# Patient Record
Sex: Female | Born: 1955 | Race: Black or African American | Hispanic: No | Marital: Single | Smoking: Former smoker
Health system: Southern US, Community
[De-identification: ages and names within clinical notes are randomized; demographics above are authoritative.]

## PROBLEM LIST (undated history)

## (undated) DIAGNOSIS — R112 Nausea with vomiting, unspecified: Secondary | ICD-10-CM

## (undated) DIAGNOSIS — S83207A Unspecified tear of unspecified meniscus, current injury, left knee, initial encounter: Secondary | ICD-10-CM

## (undated) DIAGNOSIS — Z9889 Other specified postprocedural states: Secondary | ICD-10-CM

## (undated) DIAGNOSIS — M1710 Unilateral primary osteoarthritis, unspecified knee: Secondary | ICD-10-CM

## (undated) DIAGNOSIS — M549 Dorsalgia, unspecified: Secondary | ICD-10-CM

## (undated) DIAGNOSIS — Z8719 Personal history of other diseases of the digestive system: Secondary | ICD-10-CM

## (undated) DIAGNOSIS — F419 Anxiety disorder, unspecified: Secondary | ICD-10-CM

## (undated) DIAGNOSIS — M1990 Unspecified osteoarthritis, unspecified site: Secondary | ICD-10-CM

## (undated) DIAGNOSIS — M179 Osteoarthritis of knee, unspecified: Secondary | ICD-10-CM

## (undated) DIAGNOSIS — F329 Major depressive disorder, single episode, unspecified: Secondary | ICD-10-CM

## (undated) DIAGNOSIS — M25561 Pain in right knee: Secondary | ICD-10-CM

## (undated) DIAGNOSIS — G8918 Other acute postprocedural pain: Secondary | ICD-10-CM

## (undated) DIAGNOSIS — I10 Essential (primary) hypertension: Secondary | ICD-10-CM

## (undated) DIAGNOSIS — K219 Gastro-esophageal reflux disease without esophagitis: Secondary | ICD-10-CM

## (undated) DIAGNOSIS — M797 Fibromyalgia: Secondary | ICD-10-CM

## (undated) DIAGNOSIS — Z973 Presence of spectacles and contact lenses: Secondary | ICD-10-CM

## (undated) DIAGNOSIS — F32A Depression, unspecified: Secondary | ICD-10-CM

## (undated) DIAGNOSIS — G8929 Other chronic pain: Secondary | ICD-10-CM

## (undated) HISTORY — PX: DILATION AND CURETTAGE OF UTERUS: SHX78

## (undated) HISTORY — PX: OTHER SURGICAL HISTORY: SHX169

## (undated) HISTORY — PX: KNEE ARTHROSCOPY: SUR90

---

## 1973-07-18 HISTORY — PX: OTHER SURGICAL HISTORY: SHX169

## 1987-07-19 HISTORY — PX: TOTAL ABDOMINAL HYSTERECTOMY W/ BILATERAL SALPINGOOPHORECTOMY: SHX83

## 1994-07-18 HISTORY — PX: TRIGGER FINGER RELEASE: SHX641

## 1994-07-18 HISTORY — PX: CARPAL TUNNEL RELEASE: SHX101

## 2009-01-26 ENCOUNTER — Encounter: Admission: RE | Admit: 2009-01-26 | Discharge: 2009-01-26 | Payer: Self-pay | Admitting: Orthopedic Surgery

## 2009-02-05 ENCOUNTER — Encounter: Admission: RE | Admit: 2009-02-05 | Discharge: 2009-04-10 | Payer: Self-pay | Admitting: Orthopedic Surgery

## 2009-03-09 ENCOUNTER — Encounter: Admission: RE | Admit: 2009-03-09 | Discharge: 2009-03-09 | Payer: Self-pay | Admitting: *Deleted

## 2009-03-10 ENCOUNTER — Encounter: Admission: RE | Admit: 2009-03-10 | Discharge: 2009-03-10 | Payer: Self-pay | Admitting: *Deleted

## 2009-03-14 ENCOUNTER — Encounter: Admission: RE | Admit: 2009-03-14 | Discharge: 2009-03-14 | Payer: Self-pay | Admitting: Orthopedic Surgery

## 2009-03-18 ENCOUNTER — Encounter: Admission: RE | Admit: 2009-03-18 | Discharge: 2009-03-18 | Payer: Self-pay | Admitting: *Deleted

## 2009-05-25 ENCOUNTER — Ambulatory Visit: Payer: Self-pay | Admitting: Occupational Medicine

## 2010-05-19 ENCOUNTER — Ambulatory Visit (HOSPITAL_COMMUNITY): Admission: RE | Admit: 2010-05-19 | Discharge: 2010-05-20 | Payer: Self-pay | Admitting: Orthopedic Surgery

## 2010-05-19 HISTORY — PX: ANTERIOR CERVICAL DECOMP/DISCECTOMY FUSION: SHX1161

## 2010-06-28 ENCOUNTER — Encounter
Admission: RE | Admit: 2010-06-28 | Discharge: 2010-06-28 | Payer: Self-pay | Source: Home / Self Care | Attending: Orthopedic Surgery | Admitting: Orthopedic Surgery

## 2010-06-30 ENCOUNTER — Encounter: Admit: 2010-06-30 | Payer: Self-pay | Admitting: Orthopedic Surgery

## 2010-07-14 ENCOUNTER — Encounter
Admission: RE | Admit: 2010-07-14 | Discharge: 2010-07-15 | Payer: Self-pay | Source: Home / Self Care | Attending: Orthopedic Surgery | Admitting: Orthopedic Surgery

## 2010-08-09 ENCOUNTER — Encounter: Payer: Self-pay | Admitting: *Deleted

## 2010-08-09 ENCOUNTER — Encounter
Admission: RE | Admit: 2010-08-09 | Discharge: 2010-08-09 | Payer: Self-pay | Source: Home / Self Care | Attending: Orthopedic Surgery | Admitting: Orthopedic Surgery

## 2010-08-24 ENCOUNTER — Other Ambulatory Visit (HOSPITAL_COMMUNITY): Payer: Self-pay | Admitting: Orthopedic Surgery

## 2010-08-24 DIAGNOSIS — R52 Pain, unspecified: Secondary | ICD-10-CM

## 2010-08-25 ENCOUNTER — Ambulatory Visit (HOSPITAL_COMMUNITY)
Admission: RE | Admit: 2010-08-25 | Discharge: 2010-08-25 | Disposition: A | Discharge status: Home / Self Care | Payer: BC Managed Care – PPO | Source: Ambulatory Visit | Attending: Orthopedic Surgery | Admitting: Orthopedic Surgery

## 2010-08-25 DIAGNOSIS — I517 Cardiomegaly: Secondary | ICD-10-CM | POA: Insufficient documentation

## 2010-08-25 DIAGNOSIS — R599 Enlarged lymph nodes, unspecified: Secondary | ICD-10-CM | POA: Insufficient documentation

## 2010-08-25 DIAGNOSIS — R52 Pain, unspecified: Secondary | ICD-10-CM

## 2010-08-25 DIAGNOSIS — K7689 Other specified diseases of liver: Secondary | ICD-10-CM | POA: Insufficient documentation

## 2010-08-25 MED ORDER — IOHEXOL 300 MG/ML  SOLN
100.0000 mL | Freq: Once | INTRAMUSCULAR | Status: AC | PRN
  Administered 2010-08-25: 100 mL via INTRAVENOUS

## 2010-09-29 LAB — BASIC METABOLIC PANEL
BUN: 12 mg/dL (ref 6–23)
CO2: 33 mEq/L — ABNORMAL HIGH (ref 19–32)
Calcium: 9.5 mg/dL (ref 8.4–10.5)
Chloride: 101 mEq/L (ref 96–112)
Creatinine, Ser: 1.12 mg/dL (ref 0.4–1.2)
GFR calc Af Amer: >60 mL/min (ref >60)
Glucose, Bld: 99 mg/dL (ref 70–99)

## 2010-09-29 LAB — CBC
MCH: 30.2 pg (ref 26.0–34.0)
MCHC: 33.3 g/dL (ref 30.0–36.0)
MCV: 90.7 fL (ref 78.0–100.0)
Platelets: 328 10*3/uL (ref 150–400)
RBC: 4.53 MIL/uL (ref 3.87–5.11)
RDW: 14.2 % (ref 11.5–15.5)

## 2010-09-29 LAB — SURGICAL PCR SCREEN: Staphylococcus aureus: POSITIVE — AB

## 2010-11-22 ENCOUNTER — Other Ambulatory Visit: Payer: Self-pay | Admitting: Orthopedic Surgery

## 2010-11-22 ENCOUNTER — Ambulatory Visit
Admission: RE | Admit: 2010-11-22 | Discharge: 2010-11-22 | Disposition: A | Discharge status: Home / Self Care | Payer: BC Managed Care – PPO | Source: Ambulatory Visit | Attending: Orthopedic Surgery | Admitting: Orthopedic Surgery

## 2010-11-22 DIAGNOSIS — R52 Pain, unspecified: Secondary | ICD-10-CM

## 2010-11-30 ENCOUNTER — Other Ambulatory Visit: Payer: Self-pay | Admitting: Orthopedic Surgery

## 2010-11-30 DIAGNOSIS — M25561 Pain in right knee: Secondary | ICD-10-CM

## 2010-12-02 ENCOUNTER — Ambulatory Visit
Admission: RE | Admit: 2010-12-02 | Discharge: 2010-12-02 | Disposition: A | Discharge status: Home / Self Care | Payer: BC Managed Care – PPO | Source: Ambulatory Visit | Attending: Orthopedic Surgery | Admitting: Orthopedic Surgery

## 2010-12-02 DIAGNOSIS — M25561 Pain in right knee: Secondary | ICD-10-CM

## 2010-12-09 ENCOUNTER — Other Ambulatory Visit: Payer: Self-pay | Admitting: Physical Medicine and Rehabilitation

## 2010-12-09 DIAGNOSIS — M545 Low back pain, unspecified: Secondary | ICD-10-CM

## 2010-12-24 ENCOUNTER — Other Ambulatory Visit: Payer: BC Managed Care – PPO

## 2010-12-31 ENCOUNTER — Ambulatory Visit
Admission: RE | Admit: 2010-12-31 | Discharge: 2010-12-31 | Disposition: A | Discharge status: Home / Self Care | Payer: BC Managed Care – PPO | Source: Ambulatory Visit | Attending: Physical Medicine and Rehabilitation | Admitting: Physical Medicine and Rehabilitation

## 2010-12-31 DIAGNOSIS — M545 Low back pain, unspecified: Secondary | ICD-10-CM

## 2011-08-20 IMAGING — CT CT CHEST W/ CM
3 of 5 series · 14 of 32 positions shown, 19 images · IV contrast (water/omni  & 100ml omni 300)
Comparison: Plain film of 05/19/2010 of the chest.  Lumbar spine MR
03/14/2009.

CT CHEST

CLINICAL DATA: MRI of the lumbar spine demonstrating
retroperitoneal adenopathy.

CT CHEST, ABDOMEN AND PELVIS WITH CONTRAST
TECHNIQUE: Contiguous axial images of the chest abdomen and pelvis
were obtained after IV contrast administration.
Contrast: 100  ml 3mnipaque-RLL

[Series 2: chest/abd/pelvis · axial · 0.70mm/px · z∈[-516,-446]mm · 2 of 110 slices shown]
[im 14/110  soft-tissue]
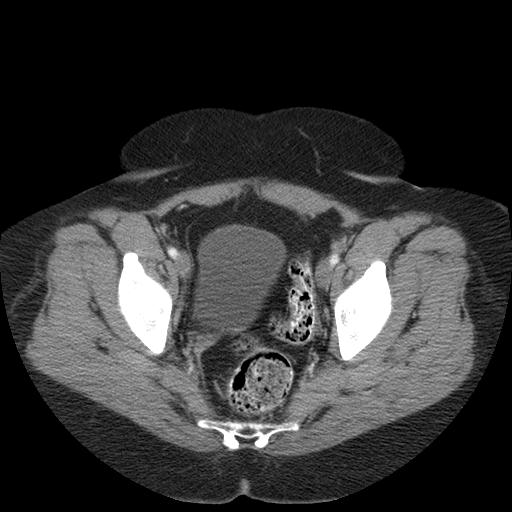
[im 28/110  soft-tissue]
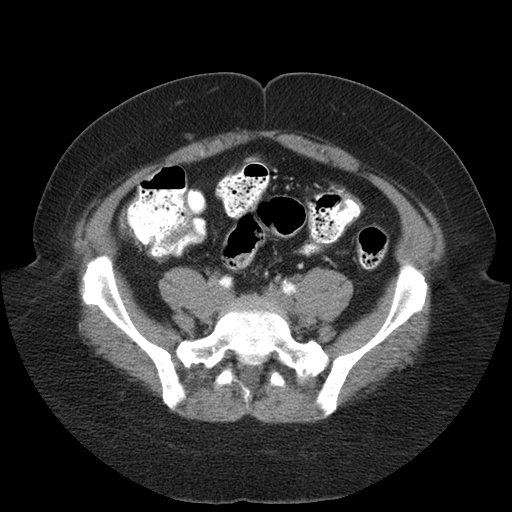

[Series 400: sagittals · sagittal · 1.17mm/px · 7 of 109 slices shown, 12 images]
[im 14/109  soft-tissue]
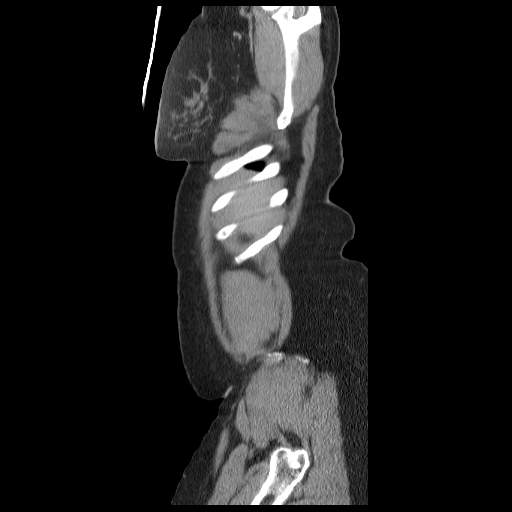
[im 14/109  lung]
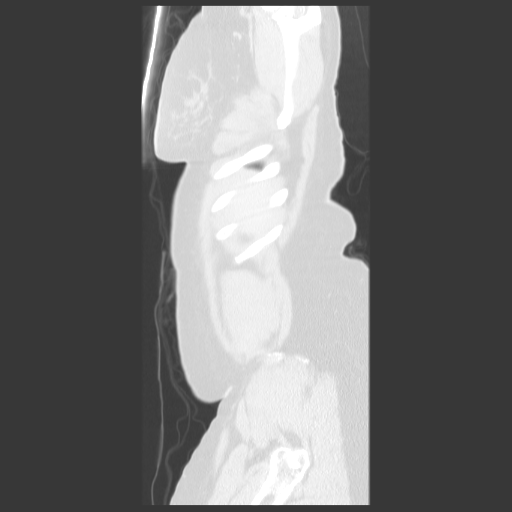
[im 14/109  bone]
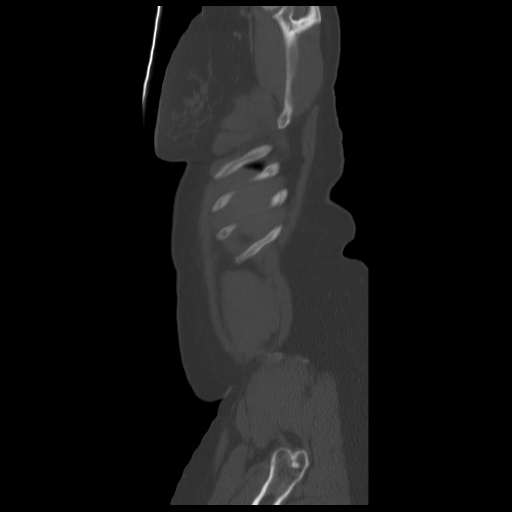
[im 28/109  soft-tissue]
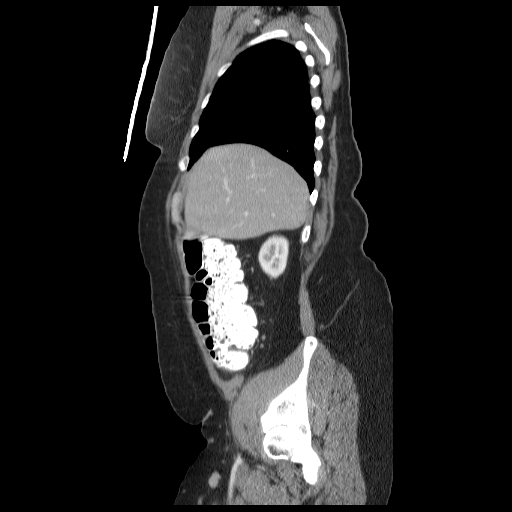
[im 28/109  lung]
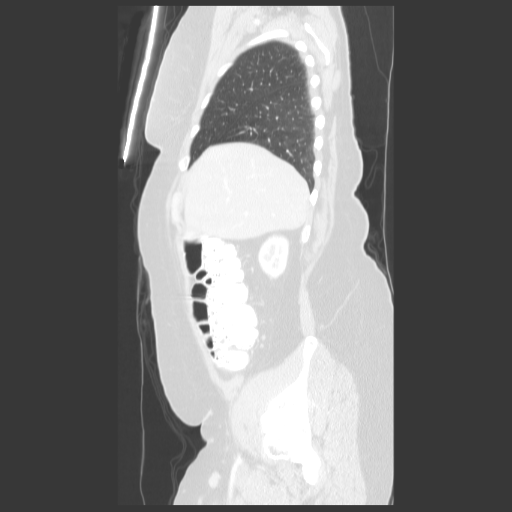
[im 41/109  soft-tissue]
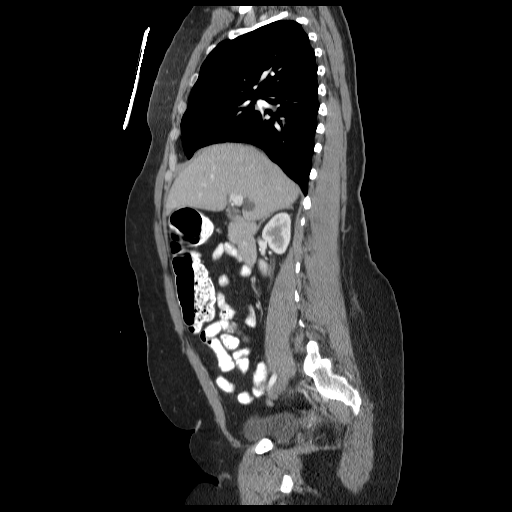
[im 41/109  lung]
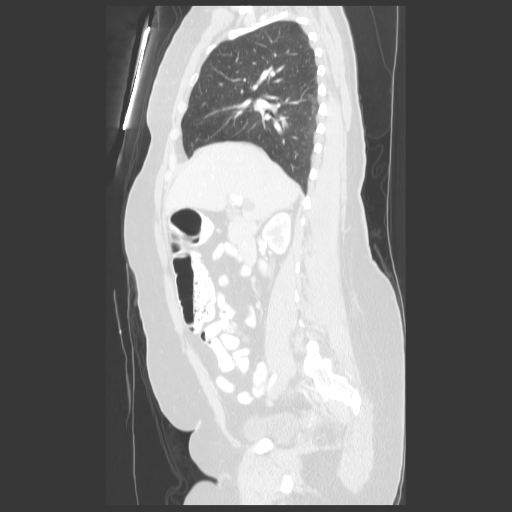
[im 55/109  soft-tissue]
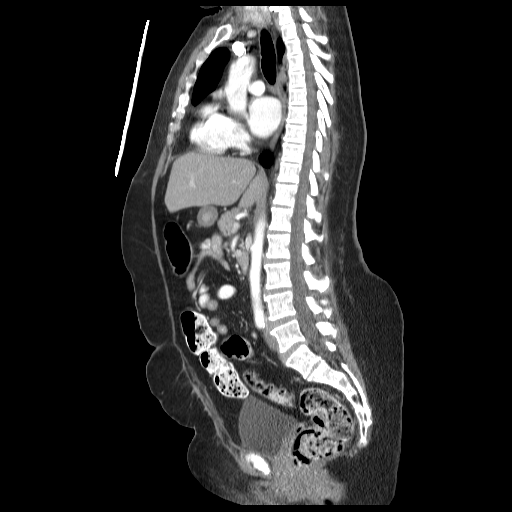
[im 55/109  lung]
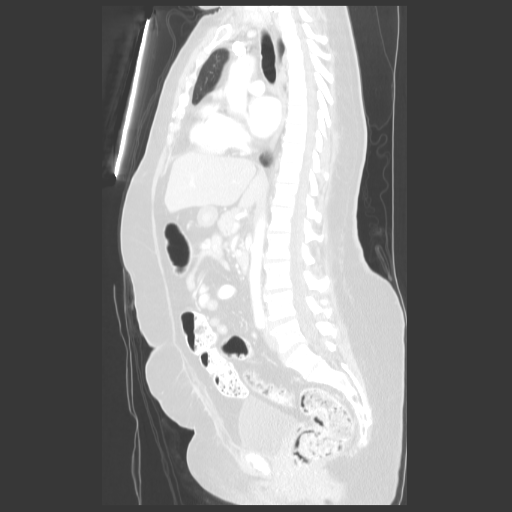
[im 68/109  soft-tissue]
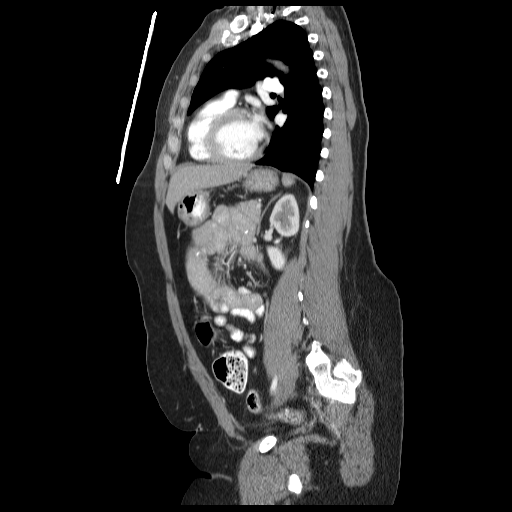
[im 82/109  soft-tissue]
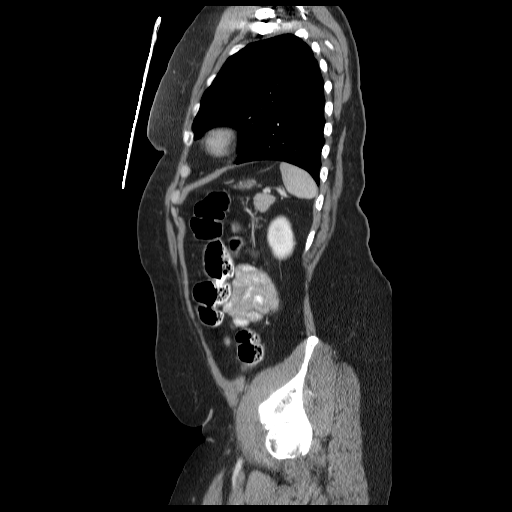
[im 95/109  soft-tissue]
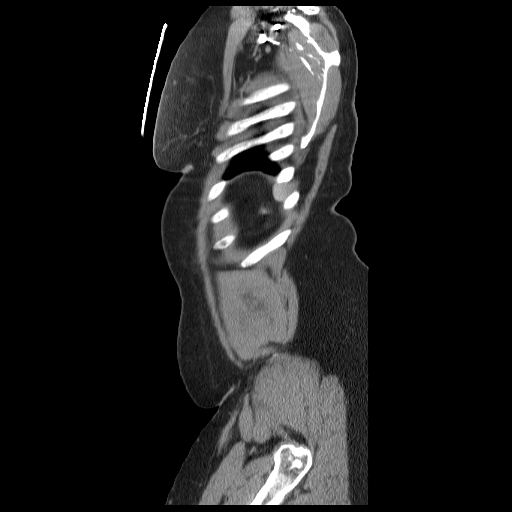

[Series 401: coronals · coronal · 1.17mm/px · 5 of 85 slices shown]
[im 15/85  soft-tissue]
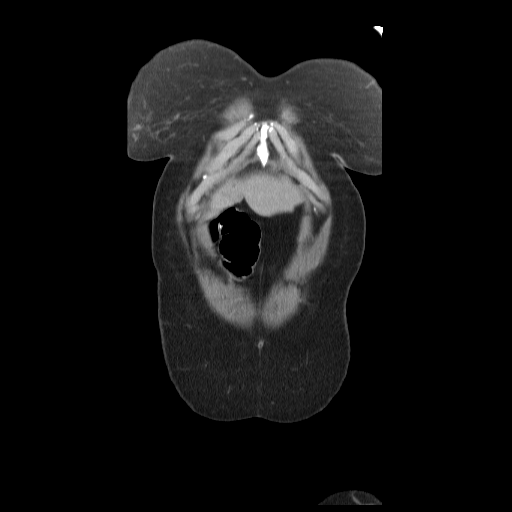
[im 29/85  soft-tissue]
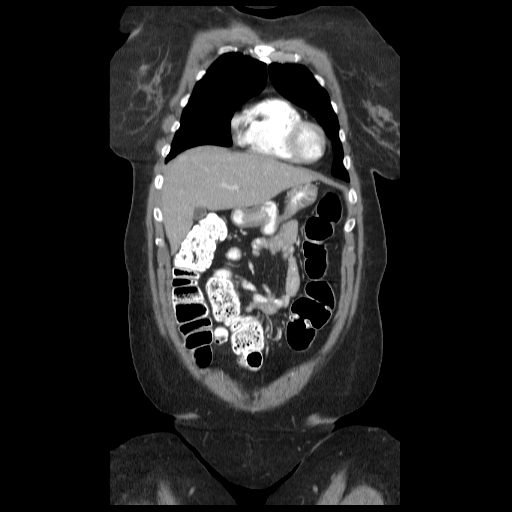
[im 43/85  soft-tissue]
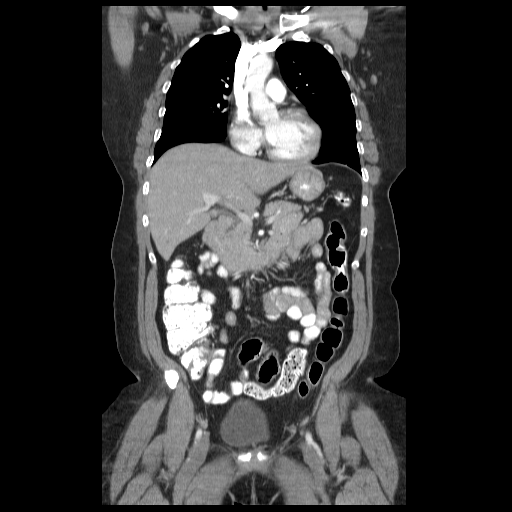
[im 57/85  soft-tissue]
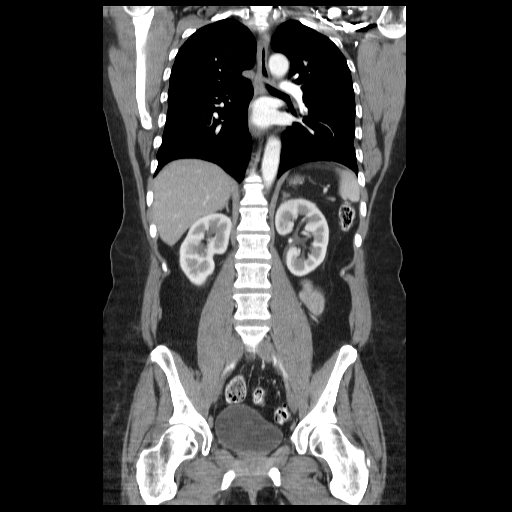
[im 71/85  soft-tissue]
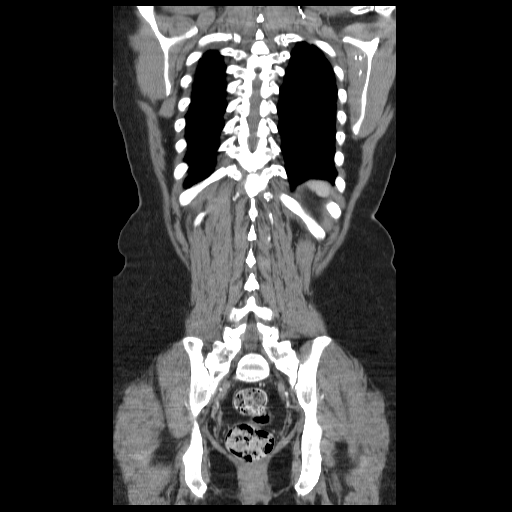

[14 of 32 positions shown; findings below may reference images not displayed]

FINDINGS: Lung windows demonstrate no nodules or airspace
opacities.

Soft tissue windows demonstrate lower cervical spine fixation.  No
axillary adenopathy.  Heart size upper limits of normal without
pericardial or pleural effusion. No mediastinal or hilar
adenopathy.
IMPRESSION: No acute process in the chest.  No adenopathy.

CT ABDOMEN AND PELVIS
FINDINGS: Scattered hepatic cysts and too small to characterize
lesions.  Normal spleen, stomach, pancreas, gallbladder, biliary
tract, adrenal glands, kidneys.

Increased number of retroperitoneal lymph nodes.  Left periaortic
index node measures 1.7 x 0.9 cm on image 68 versus 1.7 x 0.8 cm on
image 7 of series 8 of the prior lumbar spine MR dated 03/14/2009.

Index retrocaval node measures 8 mm on image 67 and is unchanged
from image 7 of series 8 of the 03/14/2009 lumbar spine MR.  No
retrocrural adenopathy.

Moderate stool within the rectum may represent constipation.
Normal terminal ileum.  9 mm left external iliac lymph node is not
pathologic by size criteria. Normal urinary bladder.  Hysterectomy.
No adnexal mass.  No significant free fluid.  No acute osseous
abnormality.
IMPRESSION: 1.  Borderline retroperitoneal adenopathy with increased number and
size of nodes.  These are similar back to 03/14/2009,  most
consistent with a benign/reactive etiology.
2. No acute process in the abdomen or pelvis.
3.  Question constipation.

## 2012-11-28 ENCOUNTER — Encounter (HOSPITAL_COMMUNITY): Payer: Self-pay | Admitting: Pharmacy Technician

## 2012-12-01 NOTE — Pre-Procedure Instructions (Addendum)
Diane Lin  12/01/2012   Your procedure is scheduled on:  Wednesday, May 21st  Report to [facility] at 0630 AM.  Call this number if you have problems the morning of surgery: [phone]   Remember:   Do not eat food or drink liquids after midnight.   Take these medicines the morning of surgery with A SIP OF WATER: xanax, toprol, neurontin, vicodin if needed, estrace, prevacid   Do not wear jewelry, make-up or nail polish.  Do not wear lotions, powders, or perfumes. You may wear deodorant.  Do not shave 48 hours prior to surgery.  Do not bring valuables to the hospital.  Contacts, dentures or bridgework may not be worn into surgery.  Leave suitcase in the car. After surgery it may be brought to your room.  For patients admitted to the hospital, checkout time is 11:00 AM the day of discharge.   Patients discharged the day of surgery will not be allowed to drive home.   Special Instructions: Shower using CHG 2 nights before surgery and the night before surgery.  If you shower the day of surgery use CHG.  Use special wash - you have one bottle of CHG for all showers.  You should use approximately 1/3 of the bottle for each shower.   Please read over the following fact sheets that you were given: Pain Booklet, Coughing and Deep Breathing, MRSA Information and Surgical Site Infection Prevention

## 2012-12-03 ENCOUNTER — Encounter (HOSPITAL_COMMUNITY): Payer: Self-pay

## 2012-12-03 ENCOUNTER — Ambulatory Visit (HOSPITAL_COMMUNITY)
Admission: RE | Admit: 2012-12-03 | Discharge: 2012-12-03 | Disposition: A | Discharge status: Home / Self Care | Payer: BC Managed Care – PPO | Source: Ambulatory Visit | Attending: Orthopedic Surgery | Admitting: Orthopedic Surgery

## 2012-12-03 ENCOUNTER — Encounter (HOSPITAL_COMMUNITY)
Admission: RE | Admit: 2012-12-03 | Discharge: 2012-12-03 | Disposition: A | Discharge status: Home / Self Care | Payer: BC Managed Care – PPO | Source: Ambulatory Visit | Attending: Orthopedic Surgery | Admitting: Orthopedic Surgery

## 2012-12-03 DIAGNOSIS — I10 Essential (primary) hypertension: Secondary | ICD-10-CM | POA: Insufficient documentation

## 2012-12-03 DIAGNOSIS — Z01810 Encounter for preprocedural cardiovascular examination: Secondary | ICD-10-CM | POA: Insufficient documentation

## 2012-12-03 DIAGNOSIS — M5146 Schmorl's nodes, lumbar region: Secondary | ICD-10-CM | POA: Insufficient documentation

## 2012-12-03 DIAGNOSIS — Z01818 Encounter for other preprocedural examination: Secondary | ICD-10-CM | POA: Insufficient documentation

## 2012-12-03 DIAGNOSIS — F17200 Nicotine dependence, unspecified, uncomplicated: Secondary | ICD-10-CM | POA: Insufficient documentation

## 2012-12-03 DIAGNOSIS — M51379 Other intervertebral disc degeneration, lumbosacral region without mention of lumbar back pain or lower extremity pain: Secondary | ICD-10-CM | POA: Insufficient documentation

## 2012-12-03 DIAGNOSIS — M5137 Other intervertebral disc degeneration, lumbosacral region: Secondary | ICD-10-CM | POA: Insufficient documentation

## 2012-12-03 DIAGNOSIS — Z01812 Encounter for preprocedural laboratory examination: Secondary | ICD-10-CM | POA: Insufficient documentation

## 2012-12-03 HISTORY — DX: Fibromyalgia: M79.7

## 2012-12-03 HISTORY — DX: Anxiety disorder, unspecified: F41.9

## 2012-12-03 HISTORY — DX: Nausea with vomiting, unspecified: R11.2

## 2012-12-03 HISTORY — DX: Gastro-esophageal reflux disease without esophagitis: K21.9

## 2012-12-03 HISTORY — DX: Depression, unspecified: F32.A

## 2012-12-03 HISTORY — DX: Personal history of other diseases of the digestive system: Z87.19

## 2012-12-03 HISTORY — DX: Unspecified osteoarthritis, unspecified site: M19.90

## 2012-12-03 HISTORY — DX: Major depressive disorder, single episode, unspecified: F32.9

## 2012-12-03 HISTORY — DX: Essential (primary) hypertension: I10

## 2012-12-03 HISTORY — DX: Other specified postprocedural states: Z98.890

## 2012-12-03 LAB — CBC
HCT: 38.6 % (ref 36.0–46.0)
Hemoglobin: 13 g/dL (ref 12.0–15.0)
MCHC: 33.7 g/dL (ref 30.0–36.0)
RBC: 4.46 MIL/uL (ref 3.87–5.11)

## 2012-12-03 LAB — BASIC METABOLIC PANEL
BUN: 19 mg/dL (ref 6–23)
Chloride: 104 mEq/L (ref 96–112)
GFR calc Af Amer: 66 mL/min — ABNORMAL LOW (ref >90)
GFR calc non Af Amer: 57 mL/min — ABNORMAL LOW (ref >90)
Glucose, Bld: 79 mg/dL (ref 70–99)
Potassium: 3.6 mEq/L (ref 3.5–5.1)
Sodium: 140 mEq/L (ref 135–145)

## 2012-12-03 LAB — SURGICAL PCR SCREEN: MRSA, PCR: NEGATIVE

## 2012-12-03 LAB — ABO/RH: ABO/RH(D): A POS

## 2012-12-03 LAB — TYPE AND SCREEN
ABO/RH(D): A POS
Antibody Screen: NEGATIVE

## 2012-12-03 NOTE — Progress Notes (Signed)
Pt. Reports that Dr. Harlow Mares with [facility] Cares for her med. Needs.  She reports her last ekg would have been when she had surgery here last.- 2011.

## 2012-12-04 MED ORDER — ACETAMINOPHEN 10 MG/ML IV SOLN
1000.0000 mg | Freq: Once | INTRAVENOUS | Status: AC
  Administered 2012-12-05: 1000 mg via INTRAVENOUS
  Filled 2012-12-04: qty 100

## 2012-12-04 MED ORDER — CEFAZOLIN SODIUM-DEXTROSE 2-3 GM-% IV SOLR
2.0000 g | INTRAVENOUS | Status: AC
  Administered 2012-12-05 (×2): 2 g via INTRAVENOUS
  Filled 2012-12-04: qty 50

## 2012-12-04 MED ORDER — DEXAMETHASONE SODIUM PHOSPHATE 4 MG/ML IJ SOLN
4.0000 mg | Freq: Once | INTRAMUSCULAR | Status: AC
  Administered 2012-12-05: 10 mg via INTRAVENOUS
  Filled 2012-12-04 (×2): qty 1

## 2012-12-04 NOTE — Progress Notes (Addendum)
Left message on pt's answering machine informing her of time change for surgery. Stated to arrive at 1030 am and to call back at [phone] for confirmation.  Pt. Returned call, stated she will be here tomorrow at 1030.

## 2012-12-04 NOTE — Progress Notes (Addendum)
Anesthesia Chart Review:  Patient is a 57 year old female scheduled for L5-S1 transforaminal lumbar interbody fusion by Dr. Shon Baton on 12/05/2012. History includes smoking, postoperative nausea/vomiting, hypertension, fibromyalgia, GERD, hiatal hernia, arthritis, depression, anxiety, ACDF, hysterectomy, appendectomy.  PCP is Dr. Harlow Mares with [facility], records requested but pending.  EKG on 12/03/12 showed normal sinus rhythm, possible left atrial enlargement, cannot rule out anterior infarct (age undetermined). Currently, there are no comparison EKGs available.  Chest x-ray on 12/03/2012 showed no acute cardio pulmonary abnormality.  Preoperative labs noted.  I'll review additional PCP records if available, otherwise clinical correlation on the day of surgery.  She has no known history of MI/CHF/DM and no CV symptoms documented at her PAT visit.  If she remains asymptomatic from a CV standpoint then would anticipate she could proceed as planned. (Update: 2012 and 2011 EKGs from PCP office noted.)  Velna Ochs [facility]/Anesthesiology Phone [phone] 12/04/2012 9:57 AM

## 2012-12-05 ENCOUNTER — Inpatient Hospital Stay (HOSPITAL_COMMUNITY)
Admission: RE | Admit: 2012-12-05 | Discharge: 2012-12-07 | DRG: 756 | Disposition: A | Discharge status: Home / Self Care | Payer: BC Managed Care – PPO | Source: Ambulatory Visit | Attending: Orthopedic Surgery | Admitting: Orthopedic Surgery

## 2012-12-05 ENCOUNTER — Ambulatory Visit (HOSPITAL_COMMUNITY): Payer: BC Managed Care – PPO

## 2012-12-05 ENCOUNTER — Encounter (HOSPITAL_COMMUNITY): Payer: Self-pay | Admitting: Vascular Surgery

## 2012-12-05 ENCOUNTER — Encounter (HOSPITAL_COMMUNITY): Payer: Self-pay | Admitting: Anesthesiology

## 2012-12-05 ENCOUNTER — Ambulatory Visit (HOSPITAL_COMMUNITY): Payer: BC Managed Care – PPO | Admitting: Anesthesiology

## 2012-12-05 ENCOUNTER — Encounter (HOSPITAL_COMMUNITY)
Admission: RE | Disposition: A | Discharge status: Home / Self Care | Payer: Self-pay | Source: Ambulatory Visit | Attending: Orthopedic Surgery

## 2012-12-05 DIAGNOSIS — I10 Essential (primary) hypertension: Secondary | ICD-10-CM | POA: Diagnosis present

## 2012-12-05 DIAGNOSIS — F3289 Other specified depressive episodes: Secondary | ICD-10-CM | POA: Diagnosis present

## 2012-12-05 DIAGNOSIS — F329 Major depressive disorder, single episode, unspecified: Secondary | ICD-10-CM | POA: Diagnosis present

## 2012-12-05 DIAGNOSIS — M51379 Other intervertebral disc degeneration, lumbosacral region without mention of lumbar back pain or lower extremity pain: Principal | ICD-10-CM | POA: Diagnosis present

## 2012-12-05 DIAGNOSIS — F411 Generalized anxiety disorder: Secondary | ICD-10-CM | POA: Diagnosis present

## 2012-12-05 DIAGNOSIS — M5137 Other intervertebral disc degeneration, lumbosacral region: Principal | ICD-10-CM | POA: Diagnosis present

## 2012-12-05 DIAGNOSIS — F17200 Nicotine dependence, unspecified, uncomplicated: Secondary | ICD-10-CM | POA: Diagnosis present

## 2012-12-05 DIAGNOSIS — G8929 Other chronic pain: Secondary | ICD-10-CM | POA: Diagnosis present

## 2012-12-05 DIAGNOSIS — IMO0001 Reserved for inherently not codable concepts without codable children: Secondary | ICD-10-CM | POA: Diagnosis present

## 2012-12-05 DIAGNOSIS — K219 Gastro-esophageal reflux disease without esophagitis: Secondary | ICD-10-CM | POA: Diagnosis present

## 2012-12-05 HISTORY — PX: OTHER SURGICAL HISTORY: SHX169

## 2012-12-05 LAB — GLUCOSE, CAPILLARY: Glucose-Capillary: 135 mg/dL — ABNORMAL HIGH (ref 70–99)

## 2012-12-05 SURGERY — POSTERIOR LUMBAR FUSION 1 LEVEL
Anesthesia: General | Site: Spine Lumbar | Wound class: Clean

## 2012-12-05 MED ORDER — ALPRAZOLAM 0.5 MG PO TABS
0.5000 mg | ORAL_TABLET | Freq: Four times a day (QID) | ORAL | Status: DC
Start: 2012-12-05 — End: 2012-12-07
  Administered 2012-12-05 – 2012-12-07 (×6): 0.5 mg via ORAL
  Filled 2012-12-05 (×6): qty 1

## 2012-12-05 MED ORDER — 0.9 % SODIUM CHLORIDE (POUR BTL) OPTIME
TOPICAL | Status: DC | PRN
  Administered 2012-12-05: 1000 mL

## 2012-12-05 MED ORDER — PHENOL 1.4 % MT LIQD: 1.0000 | OROMUCOSAL | Status: DC | PRN

## 2012-12-05 MED ORDER — FENTANYL CITRATE 0.05 MG/ML IJ SOLN
INTRAMUSCULAR | Status: DC | PRN
  Administered 2012-12-05 (×15): 50 ug via INTRAVENOUS

## 2012-12-05 MED ORDER — METHOCARBAMOL 500 MG PO TABS
500.0000 mg | ORAL_TABLET | Freq: Four times a day (QID) | ORAL | Status: DC | PRN
  Administered 2012-12-06 – 2012-12-07 (×2): 500 mg via ORAL
  Filled 2012-12-05 (×2): qty 1

## 2012-12-05 MED ORDER — THROMBIN 20000 UNITS EX SOLR
CUTANEOUS | Status: AC
  Filled 2012-12-05: qty 20000

## 2012-12-05 MED ORDER — SODIUM CHLORIDE 0.9 % IJ SOLN: 9.0000 mL | INTRAMUSCULAR | Status: DC | PRN

## 2012-12-05 MED ORDER — SUCCINYLCHOLINE CHLORIDE 20 MG/ML IJ SOLN
INTRAMUSCULAR | Status: DC | PRN
  Administered 2012-12-05: 120 mg via INTRAVENOUS

## 2012-12-05 MED ORDER — SODIUM CHLORIDE 0.9 % IJ SOLN
3.0000 mL | Freq: Two times a day (BID) | INTRAMUSCULAR | Status: DC
  Administered 2012-12-06 – 2012-12-07 (×3): 3 mL via INTRAVENOUS

## 2012-12-05 MED ORDER — DEXAMETHASONE 4 MG PO TABS
4.0000 mg | ORAL_TABLET | Freq: Four times a day (QID) | ORAL | Status: DC
  Administered 2012-12-05 – 2012-12-07 (×6): 4 mg via ORAL
  Filled 2012-12-05 (×10): qty 1

## 2012-12-05 MED ORDER — DEXAMETHASONE SODIUM PHOSPHATE 4 MG/ML IJ SOLN
4.0000 mg | Freq: Four times a day (QID) | INTRAMUSCULAR | Status: DC
  Filled 2012-12-05 (×8): qty 1

## 2012-12-05 MED ORDER — SODIUM CHLORIDE 0.9 % IJ SOLN: 3.0000 mL | INTRAMUSCULAR | Status: DC | PRN

## 2012-12-05 MED ORDER — METHOCARBAMOL 100 MG/ML IJ SOLN
500.0000 mg | Freq: Four times a day (QID) | INTRAVENOUS | Status: DC | PRN
  Filled 2012-12-05: qty 5

## 2012-12-05 MED ORDER — CEFAZOLIN SODIUM-DEXTROSE 2-3 GM-% IV SOLR
2.0000 g | INTRAVENOUS | Status: DC
  Filled 2012-12-05: qty 50

## 2012-12-05 MED ORDER — ACETAMINOPHEN 10 MG/ML IV SOLN
1000.0000 mg | Freq: Four times a day (QID) | INTRAVENOUS | Status: AC
  Administered 2012-12-05 – 2012-12-06 (×4): 1000 mg via INTRAVENOUS
  Filled 2012-12-05 (×4): qty 100

## 2012-12-05 MED ORDER — OXYCODONE HCL 5 MG PO TABS
10.0000 mg | ORAL_TABLET | ORAL | Status: DC | PRN
  Administered 2012-12-06 – 2012-12-07 (×4): 10 mg via ORAL
  Filled 2012-12-05: qty 2
  Filled 2012-12-05: qty 1
  Filled 2012-12-05: qty 2
  Filled 2012-12-05: qty 1
  Filled 2012-12-05: qty 2

## 2012-12-05 MED ORDER — MENTHOL 3 MG MT LOZG: 1.0000 | LOZENGE | OROMUCOSAL | Status: DC | PRN

## 2012-12-05 MED ORDER — CEFAZOLIN SODIUM-DEXTROSE 2-3 GM-% IV SOLR: 2.0000 g | INTRAVENOUS | Status: DC

## 2012-12-05 MED ORDER — MORPHINE SULFATE (PF) 1 MG/ML IV SOLN
INTRAVENOUS | Status: DC
  Administered 2012-12-05: 19:00:00 via INTRAVENOUS
  Administered 2012-12-05: 10 mg via INTRAVENOUS
  Administered 2012-12-06: 3 mg via INTRAVENOUS

## 2012-12-05 MED ORDER — DIPHENHYDRAMINE HCL 12.5 MG/5ML PO ELIX: 12.5000 mg | ORAL_SOLUTION | Freq: Four times a day (QID) | ORAL | Status: DC | PRN

## 2012-12-05 MED ORDER — NALOXONE HCL 0.4 MG/ML IJ SOLN: 0.4000 mg | INTRAMUSCULAR | Status: DC | PRN

## 2012-12-05 MED ORDER — ACETAMINOPHEN 10 MG/ML IV SOLN
INTRAVENOUS | Status: AC
  Filled 2012-12-05: qty 100

## 2012-12-05 MED ORDER — GABAPENTIN 100 MG PO CAPS
100.0000 mg | ORAL_CAPSULE | Freq: Three times a day (TID) | ORAL | Status: DC
  Administered 2012-12-05 – 2012-12-07 (×5): 100 mg via ORAL
  Filled 2012-12-05 (×8): qty 1

## 2012-12-05 MED ORDER — CEFAZOLIN SODIUM 1-5 GM-% IV SOLN
1.0000 g | Freq: Three times a day (TID) | INTRAVENOUS | Status: AC
  Administered 2012-12-05 – 2012-12-06 (×2): 1 g via INTRAVENOUS
  Filled 2012-12-05 (×2): qty 50

## 2012-12-05 MED ORDER — PROPOFOL 10 MG/ML IV BOLUS
INTRAVENOUS | Status: DC | PRN
  Administered 2012-12-05 (×3): 50 mg via INTRAVENOUS
  Administered 2012-12-05: 200 mg via INTRAVENOUS

## 2012-12-05 MED ORDER — HEMOSTATIC AGENTS (NO CHARGE) OPTIME
TOPICAL | Status: DC | PRN
  Administered 2012-12-05: 1 via TOPICAL

## 2012-12-05 MED ORDER — METOPROLOL SUCCINATE ER 50 MG PO TB24
50.0000 mg | ORAL_TABLET | Freq: Every morning | ORAL | Status: DC
  Administered 2012-12-06 – 2012-12-07 (×2): 50 mg via ORAL
  Filled 2012-12-05 (×2): qty 1

## 2012-12-05 MED ORDER — POLYETHYLENE GLYCOL 3350 17 G PO PACK
17.0000 g | PACK | Freq: Every day | ORAL | Status: DC | PRN
  Filled 2012-12-05: qty 1

## 2012-12-05 MED ORDER — ARTIFICIAL TEARS OP OINT
TOPICAL_OINTMENT | OPHTHALMIC | Status: DC | PRN
  Administered 2012-12-05: 1 via OPHTHALMIC

## 2012-12-05 MED ORDER — MORPHINE SULFATE (PF) 1 MG/ML IV SOLN
INTRAVENOUS | Status: AC
  Filled 2012-12-05: qty 25

## 2012-12-05 MED ORDER — LACTATED RINGERS IV SOLN
INTRAVENOUS | Status: DC | PRN
  Administered 2012-12-05 (×4): via INTRAVENOUS

## 2012-12-05 MED ORDER — HYDROMORPHONE HCL PF 1 MG/ML IJ SOLN: 0.2500 mg | INTRAMUSCULAR | Status: DC | PRN

## 2012-12-05 MED ORDER — OXYCODONE HCL 5 MG/5ML PO SOLN: 5.0000 mg | Freq: Once | ORAL | Status: DC | PRN

## 2012-12-05 MED ORDER — ONDANSETRON HCL 4 MG/2ML IJ SOLN
INTRAMUSCULAR | Status: DC | PRN
  Administered 2012-12-05 (×2): 4 mg via INTRAVENOUS

## 2012-12-05 MED ORDER — BUPIVACAINE-EPINEPHRINE 0.25% -1:200000 IJ SOLN
INTRAMUSCULAR | Status: DC | PRN
  Administered 2012-12-05: 10 mL

## 2012-12-05 MED ORDER — LIDOCAINE HCL (CARDIAC) 20 MG/ML IV SOLN
INTRAVENOUS | Status: DC | PRN
  Administered 2012-12-05: 100 mg via INTRAVENOUS

## 2012-12-05 MED ORDER — PROPOFOL INFUSION 10 MG/ML OPTIME
INTRAVENOUS | Status: DC | PRN
  Administered 2012-12-05: 50 ug/kg/min via INTRAVENOUS

## 2012-12-05 MED ORDER — HYDROCHLOROTHIAZIDE 12.5 MG PO CAPS
12.5000 mg | ORAL_CAPSULE | Freq: Every morning | ORAL | Status: DC
  Administered 2012-12-06 – 2012-12-07 (×2): 12.5 mg via ORAL
  Filled 2012-12-05 (×2): qty 1

## 2012-12-05 MED ORDER — LACTATED RINGERS IV SOLN: INTRAVENOUS | Status: DC

## 2012-12-05 MED ORDER — BUPIVACAINE-EPINEPHRINE PF 0.25-1:200000 % IJ SOLN
INTRAMUSCULAR | Status: AC
  Filled 2012-12-05: qty 30

## 2012-12-05 MED ORDER — MIDAZOLAM HCL 5 MG/5ML IJ SOLN
INTRAMUSCULAR | Status: DC | PRN
  Administered 2012-12-05 (×2): 1 mg via INTRAVENOUS

## 2012-12-05 MED ORDER — ONDANSETRON HCL 4 MG/2ML IJ SOLN: 4.0000 mg | Freq: Four times a day (QID) | INTRAMUSCULAR | Status: DC | PRN

## 2012-12-05 MED ORDER — OXYCODONE HCL 5 MG PO TABS: 5.0000 mg | ORAL_TABLET | Freq: Once | ORAL | Status: DC | PRN

## 2012-12-05 MED ORDER — ONDANSETRON HCL 4 MG/2ML IJ SOLN: 4.0000 mg | INTRAMUSCULAR | Status: DC | PRN

## 2012-12-05 MED ORDER — ZOLPIDEM TARTRATE 5 MG PO TABS
5.0000 mg | ORAL_TABLET | Freq: Every evening | ORAL | Status: DC | PRN
  Administered 2012-12-05: 5 mg via ORAL
  Filled 2012-12-05: qty 1

## 2012-12-05 MED ORDER — SODIUM CHLORIDE 0.9 % IV SOLN: 250.0000 mL | INTRAVENOUS | Status: DC

## 2012-12-05 MED ORDER — THROMBIN 20000 UNITS EX SOLR
CUTANEOUS | Status: DC | PRN
  Administered 2012-12-05: 14:00:00 via TOPICAL

## 2012-12-05 MED ORDER — LACTATED RINGERS IV SOLN
INTRAVENOUS | Status: DC
  Administered 2012-12-05: 11:00:00 via INTRAVENOUS

## 2012-12-05 MED ORDER — DIPHENHYDRAMINE HCL 50 MG/ML IJ SOLN: 12.5000 mg | Freq: Four times a day (QID) | INTRAMUSCULAR | Status: DC | PRN

## 2012-12-05 SURGICAL SUPPLY — 72 items
BLADE SURG ROTATE 9660 (MISCELLANEOUS) IMPLANT
BUR EGG ELITE 4.0 (BURR) ×2 IMPLANT
CAP SPINAL LOCKING TI (Cap) ×8 IMPLANT
CLOTH BEACON ORANGE TIMEOUT ST (SAFETY) ×2 IMPLANT
CLSR STERI-STRIP ANTIMIC 1/2X4 (GAUZE/BANDAGES/DRESSINGS) ×4 IMPLANT
CORDS BIPOLAR (ELECTRODE) ×2 IMPLANT
COVER MAYO STAND STRL (DRAPES) ×4 IMPLANT
COVER SURGICAL LIGHT HANDLE (MISCELLANEOUS) ×2 IMPLANT
DERMABOND ADVANCED (GAUZE/BANDAGES/DRESSINGS) ×1
DERMABOND ADVANCED .7 DNX12 (GAUZE/BANDAGES/DRESSINGS) ×1 IMPLANT
DRAPE C-ARM 42X72 X-RAY (DRAPES) ×4 IMPLANT
DRAPE ORTHO SPLIT 77X108 STRL (DRAPES) ×1
DRAPE POUCH INSTRU U-SHP 10X18 (DRAPES) ×2 IMPLANT
DRAPE SURG 17X23 STRL (DRAPES) ×2 IMPLANT
DRAPE SURG ORHT 6 SPLT 77X108 (DRAPES) ×1 IMPLANT
DRAPE U-SHAPE 47X51 STRL (DRAPES) ×2 IMPLANT
DRSG MEPILEX BORDER 4X8 (GAUZE/BANDAGES/DRESSINGS) ×4 IMPLANT
DURAPREP 26ML APPLICATOR (WOUND CARE) ×2 IMPLANT
ELECT BLADE 4.0 EZ CLEAN MEGAD (MISCELLANEOUS)
ELECT BLADE 6.5 EXT (BLADE) ×2 IMPLANT
ELECT REM PT RETURN 9FT ADLT (ELECTROSURGICAL) ×2
ELECTRODE BLDE 4.0 EZ CLN MEGD (MISCELLANEOUS) IMPLANT
ELECTRODE REM PT RTRN 9FT ADLT (ELECTROSURGICAL) ×1 IMPLANT
GLOVE BIOGEL PI IND STRL 6.5 (GLOVE) IMPLANT
GLOVE BIOGEL PI IND STRL 8.5 (GLOVE) ×2 IMPLANT
GLOVE BIOGEL PI INDICATOR 6.5 (GLOVE)
GLOVE BIOGEL PI INDICATOR 8.5 (GLOVE) ×2
GLOVE ECLIPSE 6.0 STRL STRAW (GLOVE) IMPLANT
GLOVE ECLIPSE 8.5 STRL (GLOVE) ×4 IMPLANT
GOWN PREVENTION PLUS XXLARGE (GOWN DISPOSABLE) ×4 IMPLANT
GOWN STRL NON-REIN LRG LVL3 (GOWN DISPOSABLE) IMPLANT
GUIDEWIRE 1.6X480MM (WIRE) ×8 IMPLANT
IMPLANT TLIF LORDOTIC 12MM LRG (Orthopedic Implant) ×2 IMPLANT
IV CATH 14GX2 1/4 (CATHETERS) IMPLANT
KIT BASIN OR (CUSTOM PROCEDURE TRAY) ×2 IMPLANT
KIT ORACLE NEUROMONITING (KITS) ×2 IMPLANT
KIT POSITION SURG JACKSON T1 (MISCELLANEOUS) IMPLANT
KIT ROOM TURNOVER OR (KITS) ×2 IMPLANT
NEEDLE 22X1 1/2 (OR ONLY) (NEEDLE) ×2 IMPLANT
NEEDLE I-PASS III (NEEDLE) ×2 IMPLANT
NEEDLE SPNL 18GX3.5 QUINCKE PK (NEEDLE) ×2 IMPLANT
NS IRRIG 1000ML POUR BTL (IV SOLUTION) ×2 IMPLANT
PACK LAMINECTOMY ORTHO (CUSTOM PROCEDURE TRAY) ×2 IMPLANT
PACK UNIVERSAL I (CUSTOM PROCEDURE TRAY) ×2 IMPLANT
PAD ARMBOARD 7.5X6 YLW CONV (MISCELLANEOUS) ×4 IMPLANT
PATTIES SURGICAL .5 X.5 (GAUZE/BANDAGES/DRESSINGS) ×2 IMPLANT
PATTIES SURGICAL .5 X1 (DISPOSABLE) ×4 IMPLANT
PUTTY BONE DBX 5CC MIX (Putty) ×2 IMPLANT
ROD 40MM (Rod) ×1 IMPLANT
ROD MATRIX MIS 45MM (Rod) ×2 IMPLANT
ROD SPNL CVD 40X5.5XHRD NS (Rod) ×1 IMPLANT
SCREW MATRIX MIS 7.0X40MM (Screw) ×4 IMPLANT
SCREW MATRIX MIS 7.0X45MM (Screw) ×2 IMPLANT
SCREW MATRIX MIS 8X35MM (Screw) ×2 IMPLANT
SPONGE LAP 4X18 X RAY DECT (DISPOSABLE) ×4 IMPLANT
SPONGE SURGIFOAM ABS GEL 100 (HEMOSTASIS) ×2 IMPLANT
STRIP CLOSURE SKIN 1/2X4 (GAUZE/BANDAGES/DRESSINGS) ×2 IMPLANT
SURGIFLO TRUKIT (HEMOSTASIS) ×2 IMPLANT
SUT MNCRL AB 3-0 PS2 18 (SUTURE) ×4 IMPLANT
SUT VIC AB 1 CT1 27 (SUTURE) ×2
SUT VIC AB 1 CT1 27XBRD ANBCTR (SUTURE) ×2 IMPLANT
SUT VIC AB 2-0 CT1 18 (SUTURE) ×2 IMPLANT
SUT VICRYL 0 UR6 27IN ABS (SUTURE) IMPLANT
SYR BULB IRRIGATION 50ML (SYRINGE) ×2 IMPLANT
SYR CONTROL 10ML LL (SYRINGE) ×4 IMPLANT
TAP CANN 6MM (TAP) ×2 IMPLANT
TAP MATRIX MIS 7.0MM (TAP) ×2 IMPLANT
TOWEL OR 17X24 6PK STRL BLUE (TOWEL DISPOSABLE) ×2 IMPLANT
TOWEL OR 17X26 10 PK STRL BLUE (TOWEL DISPOSABLE) ×2 IMPLANT
TRAY FOLEY CATH 14FR (SET/KITS/TRAYS/PACK) ×2 IMPLANT
WATER STERILE IRR 1000ML POUR (IV SOLUTION) IMPLANT
YANKAUER SUCT BULB TIP NO VENT (SUCTIONS) ×2 IMPLANT

## 2012-12-05 NOTE — Anesthesia Preprocedure Evaluation (Signed)
Anesthesia Evaluation  Patient identified by MRN, date of birth, ID band Patient awake    Reviewed: Allergy & Precautions, H&P , NPO status , Patient's Chart, lab work & pertinent test results  History of Anesthesia Complications (+) PONV  Airway Mallampati: II TM Distance: <3 FB Neck ROM: Full    Dental   Pulmonary COPDCurrent Smoker,  + rhonchi         Cardiovascular hypertension, Rhythm:Regular Rate:Normal     Neuro/Psych Anxiety Depression  Neuromuscular disease    GI/Hepatic hiatal hernia, GERD-  ,  Endo/Other    Renal/GU      Musculoskeletal  (+) Fibromyalgia -  Abdominal (+) + obese,   Peds  Hematology   Anesthesia Other Findings   Reproductive/Obstetrics                           Anesthesia Physical Anesthesia Plan  ASA: III  Anesthesia Plan: General   Post-op Pain Management:    Induction: Intravenous  Airway Management Planned: Oral ETT and Video Laryngoscope Planned  Additional Equipment:   Intra-op Plan:   Post-operative Plan: Extubation in OR  Informed Consent: I have reviewed the patients History and Physical, chart, labs and discussed the procedure including the risks, benefits and alternatives for the proposed anesthesia with the patient or authorized representative who has indicated his/her understanding and acceptance.     Plan Discussed with: CRNA and Surgeon  Anesthesia Plan Comments:         Anesthesia Quick Evaluation

## 2012-12-05 NOTE — H&P (Signed)
History of Present Illness The patient is a 57 year old female who comes in today for a preoperative History and Physical. The patient is scheduled for a TLIF L5-S1 to be performed by Dr. Debria Garret D. Shon Baton, MD at [facility] on [phone] . Please see the hospital record for complete dictated history and physical.    Allergies Cymbalta *ANTIDEPRESSANTS*   Family History Cancer. grandmother mothers side Cerebrovascular Accident. father Congestive Heart Failure. mother and father Depression. mother Heart Disease. mother, father, grandmother fathers side and grandfather fathers side Hypertension. mother, father, sister, brother, grandmother mothers side, grandfather mothers side, grandmother fathers side and grandfather fathers side Rheumatoid Arthritis. mother and father   Social History Alcohol use. never consumed alcohol Children. 0 Current work status. working full time Drug/Alcohol Rehab (Currently). no Illicit drug use. no Living situation. live alone Marital status. single Most recent primary occupation. [place] [employer] Number of flights of stairs before winded. 2-3 Pain Contract. yes Previously in rehab. no Tobacco / smoke exposure. no Tobacco use. current every day smoker; smoke(d) 3 or more pack(s) per day   Medication History ALPRAZolam ER ( Oral) Specific dose unknown - Active. Atorvastatin Calcium ( Oral) Specific dose unknown - Active. ZyrTEC ( Oral) Specific dose unknown - Active. Estrace ( Oral) Specific dose unknown - Active. Prevacid ( Oral) Specific dose unknown - Active. Metoprolol Tartrate ( Oral) Specific dose unknown - Active. MiraLax ( Oral) Specific dose unknown - Active. Ambien ( Oral) Specific dose unknown - Active. Gabapentin ( Oral) Specific dose unknown - Active. Hydrochlorothiazide ( Oral) Specific dose unknown - Active. Norco (5-325MG  Tablet, 1 (one) Oral two times daily, as needed, Taken starting  11/29/2012) Active. ([facility] [phone]/lmw)   Pregnancy / Birth History Pregnant. no   Past Surgical History Appendectomy Arthroscopy of Knee. bilateral Carpal Tunnel Repair. bilateral Neck Disc Surgery Other Surgery. TRIGGER THUMB SURGERY   Other Problems Anxiety Disorder Chronic Pain Depression Fibromyalgia Gastroesophageal Reflux Disease High blood pressure Migraine Headache Osteoarthritis Unspecified Diagnosis   Vitals 11/30/2012 2:36 PM Weight: 213 lb Height: 63 in Body Surface Area: 2.07 m Body Mass Index: 37.73 kg/m Pulse: 67 (Regular) BP: 119/86 (Sitting, Left Arm, Standard)  Objective Transcription  On clinical exam she is alert and oriented times three. No shortness of breath or chest pain. The abdomen is soft and nontender. Compartments in the lower extremity are soft and nontender. Intact peripheral pulses. She has 5/5 strength throughout. She continues to have the right knee problem requiring a brace, but there is no hip or ankle pain on the right side. There is no radicular leg pain. She has horrific back pain with palpation, forward flexion and extension. No obvious skin lesions, abrasions, or contusions. She has no pain with axial pressure applied to the head and the lumbar spine. No SOB/CP Abd soft/NT Significant back pain with ROM    The patient returns today for follow up. I have had an opportunity to review all of her medical records. In 2012, she had a lumbar discogram which demonstrated normal nuclear studies at 3-4 and 4-5. While Dr. Ethelene Hal did indicate she did have pain at the 4-5 injection, he further noted in his conclusion that he felt that this was due to an annular injection and that in fact this was a normal control level. Her pain was reproduced at L5-S1 and there was an annular leak corresponding to disc trauma. Her new MRI still shows the transition vertebra at S1-S2 and there is a castellvi  to be  communication causing bilateral transverse sacral pseudoarthrosis. I do not think, however, this is her major source of pain. The discogram confirms L5-S1, there is a slight anterior listhesis at L5-S1 and this level does correspond to the L5-S1 level seen on her discogram. At this point having tried and failed conservative management and still having predominant discogenic low back pain, we talked about a fusion surgery. She has had previous anterior surgery and has multiple scars and so I do not think an anterior approach is advisable. I think doing a minimally invasive transforaminal lumbar interbody fusion would allow me to address the discogenic back pain and stabilize this level. I told her that the goal of the surgery is reduction in her pain and not elimination of her pain. I do not think extending the fusion into the S1-S2 level is worthwhile and would not really serve to improve her pain. I do think that she has a good chance of a positive result, which would be reduction and pain and improvement in quality of life. At this point I have explained to her the risks which include infection, bleeding, nerve damage, death, stroke, paralysis, failure to heal, nonunion, hardware complications, adjacent segment degeneration, need for further surgery. She has expressed an understanding of this. Once we have clearance from her primary care physician we will move forward with planning a surgical date.

## 2012-12-05 NOTE — Brief Op Note (Signed)
12/05/2012  6:30 PM  PATIENT:  Diane Lin  57 y.o. 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  PROCEDURE:  Procedure(s) with comments: TLIF L5-S1 (N/A) - lumbar five-sacral one tlif with navigation  SURGEON:  Surgeon(s) and Role:    * Venita Lick, MD - Primary  PHYSICIAN ASSISTANT:   ASSISTANTS: none   ANESTHESIA:   general  EBL:  Total I/O In: 2000 [I.V.:2000] Out: 500 [Urine:350; Blood:150]  BLOOD ADMINISTERED:none  DRAINS: none   LOCAL MEDICATIONS USED:  MARCAINE     SPECIMEN:  No Specimen  DISPOSITION OF SPECIMEN:  N/A  COUNTS:  YES  TOURNIQUET:  * No tourniquets in log *  DICTATION: .Other Dictation: Dictation Number [id number]  PLAN OF CARE: Admit to inpatient   PATIENT DISPOSITION:  PACU - hemodynamically stable.

## 2012-12-05 NOTE — Anesthesia Procedure Notes (Signed)
Procedure Name: Intubation Date/Time: 12/05/2012 12:24 PM Performed by: Sherie Don Pre-anesthesia Checklist: Patient identified, Emergency Drugs available, Suction available, Patient being monitored and Timeout performed Patient Re-evaluated:Patient Re-evaluated prior to inductionOxygen Delivery Method: Circle system utilized Preoxygenation: Pre-oxygenation with 100% oxygen Intubation Type: IV induction Ventilation: Mask ventilation with difficulty Laryngoscope Size: Mac and 3 Grade View: Grade I Tube type: Oral Tube size: 7.5 mm Number of attempts: 1 Airway Equipment and Method: Stylet Placement Confirmation: ETT inserted through vocal cords under direct vision,  positive ETCO2 and breath sounds checked- equal and bilateral Secured at: 22 cm Tube secured with: Tape Dental Injury: Teeth and Oropharynx as per pre-operative assessment

## 2012-12-05 NOTE — Transfer of Care (Signed)
Immediate Anesthesia Transfer of Care Note  Patient: Diane Lin  Procedure(s) Performed: Procedure(s) with comments: TLIF L5-S1 (N/A) - lumbar five-sacral one tlif with navigation  Patient Location: PACU  Anesthesia Type:General  Level of Consciousness: awake, alert  and oriented  Airway & Oxygen Therapy: Patient Spontanous Breathing and Patient connected to nasal cannula oxygen  Post-op Assessment: Report given to PACU RN and Patient moving all extremities X 4  Post vital signs: Reviewed and stable  Complications: No apparent anesthesia complications

## 2012-12-05 NOTE — Preoperative (Signed)
Beta Blockers   Reason not to administer Beta Blockers:Toprol XL taken 12/05/12

## 2012-12-05 NOTE — Anesthesia Postprocedure Evaluation (Signed)
Anesthesia Post Note  Patient: Diane Lin  Procedure(s) Performed: Procedure(s) (LRB): TLIF L5-S1 (N/A)  Anesthesia type: general  Patient location: PACU  Post pain: Pain level controlled  Post assessment: Patient's Cardiovascular Status Stable  Last Vitals:  Filed Vitals:   12/05/12 2015  BP: 166/84  Pulse: 72  Temp:   Resp: 14    Post vital signs: Reviewed and stable  Level of consciousness: sedated  Complications: No apparent anesthesia complications

## 2012-12-06 NOTE — Progress Notes (Signed)
I agree with the following treatment note after reviewing documentation.   Johnston, Ein Rijo Brynn   OTR/L Pager: [phone] Office: [phone] .   

## 2012-12-06 NOTE — Evaluation (Signed)
Physical Therapy Evaluation Patient Details Name: Diane Lin MRN: [id number] DOB: April 14, 1956 Today's Date: 12/06/2012 Time: 1478-2956 PT Time Calculation (min): 44 min  PT Assessment / Plan / Recommendation Clinical Impression  57 y/o female s/p TLIF L5-S1, 1 Day Post-Op . Presents to PT with below impairments impacting functional independence. Will benefit physical therapy in the acute setting to maximize mobility and safety for d/c home with assist from family. Education on safe mobility/back precautions provided throughout session due to impulsive behavior immediately upon entering her room.     PT Assessment  Patient needs continued PT services    Follow Up Recommendations  Home health PT (vs none pending progress)    Does the patient have the potential to tolerate intense rehabilitation      Barriers to Discharge Decreased caregiver support 24 hour assist avail for the first week but following that only intermittent    Equipment Recommendations  Rolling walker with 5" wheels    Recommendations for Other Services     Frequency Min 5X/week    Precautions / Restrictions Precautions Precautions: Fall;Back Required Braces or Orthoses: Spinal Brace Spinal Brace: Applied in sitting position;Lumbar corset Restrictions Weight Bearing Restrictions: No   Pertinent Vitals/Pain Reports 9/10 pain (lateral hip/radicular type pain), RN notified      Mobility  Bed Mobility Bed Mobility: Rolling Right;Right Sidelying to Sit Rolling Right: 4: Min guard Right Sidelying to Sit: 4: Min guard;HOB flat Details for Bed Mobility Assistance: impulsive needing cues to slow down and for correct log rolling->sit technique, cues to decrease twisting Transfers Transfers: Sit to Stand;Stand to Sit Sit to Stand: 4: Min assist;With upper extremity assist Stand to Sit: 4: Min assist;With upper extremity assist Details for Transfer Assistance: sit->stand initially patient immediately complaining  of radicular/neuropathic pain in L5-S1 distribution (lateral hip/greater trochanter area) causing great anxiety and impulsive behavior, patient needing max verbal and tactile cues to calm and return to sit to gait composure, then patient stood after calming with min stability assit and RW in front of her, good control and improved pain control using RW to support trunk in standing Ambulation/Gait Ambulation/Gait Assistance: 4: Min guard Ambulation Distance (Feet): 150 Feet Assistive device: Rolling walker Ambulation/Gait Assistance Details: verbal cues for safe positioning and sequencing with RW as well as cues for adherence to precautions especially twisting Gait Pattern: Step-through pattern;Decreased stride length;Decreased trunk rotation Gait velocity: decreased General Gait Details: very cautious pace         PT Diagnosis: Difficulty walking;Generalized weakness;Acute pain  PT Problem List: Decreased strength;Decreased safety awareness;Decreased knowledge of use of DME;Decreased knowledge of precautions;Decreased mobility PT Treatment Interventions: DME instruction;Gait training;Stair training;Functional mobility training;Therapeutic activities;Therapeutic exercise;Balance training;Neuromuscular re-education;Patient/family education   PT Goals Acute Rehab PT Goals PT Goal Formulation: With patient Time For Goal Achievement: 12/13/12 Potential to Achieve Goals: Good Pt will Roll Supine to Right Side: with modified independence PT Goal: Rolling Supine to Right Side - Progress: Goal set today Pt will go Supine/Side to Sit: with modified independence PT Goal: Supine/Side to Sit - Progress: Goal set today Pt will go Sit to Supine/Side: with modified independence PT Goal: Sit to Supine/Side - Progress: Goal set today Pt will go Sit to Stand: with modified independence PT Goal: Sit to Stand - Progress: Goal set today Pt will go Stand to Sit: with modified independence PT Goal: Stand to  Sit - Progress: Goal set today Pt will Ambulate: >150 feet;with modified independence;with least restrictive assistive device PT Goal: Ambulate - Progress: Goal  set today Pt will Go Up / Down Stairs: Flight;with rail(s);with supervision PT Goal: Up/Down Stairs - Progress: Goal set today Additional Goals Additional Goal #1: Pt will verbalize and demonstrate during functional mobility understanding of 3/3 back precautions independently.  PT Goal: Additional Goal #1 - Progress: Goal set today  Visit Information  Last PT Received On: 12/06/12 Assistance Needed: +1    Subjective Data  Subjective: Just tell me "chill Liborio Nixon." Patient Stated Goal: home   Prior Functioning  Home Living Lives With: Alone Available Help at Discharge: Family;Available 24 hours/day (for the first 5 days her niece) Type of Home: Apartment Home Access: Stairs to enter Entrance Stairs-Number of Steps: 15 steps Entrance Stairs-Rails: Left Home Layout: One level Bathroom Shower/Tub: Engineer, manufacturing systems: Standard Home Adaptive Equipment: Straight cane Prior Function Level of Independence: Independent Able to Take Stairs?: Yes Driving: Yes Vocation: Retired Musician: No difficulties    Copywriter, advertising Arousal/Alertness: Awake/alert Behavior During Therapy: Anxious Overall Cognitive Status: Within Functional Limits for tasks assessed    Extremity/Trunk Assessment Right Upper Extremity Assessment RUE ROM/Strength/Tone: Within functional levels Left Upper Extremity Assessment LUE ROM/Strength/Tone: Within functional levels Right Lower Extremity Assessment RLE ROM/Strength/Tone: Within functional levels RLE Sensation: Deficits RLE Sensation Deficits: radiular pain/buring sensation L5-S1 distribution (lateral hip, over the greater tubercle) Left Lower Extremity Assessment LLE ROM/Strength/Tone: Within functional levels Trunk Assessment Trunk Assessment: Normal    Balance    End of Session PT - End of Session Equipment Utilized During Treatment: Gait belt Activity Tolerance: Patient tolerated treatment well Patient left: in chair;with call bell/phone within reach Nurse Communication: Mobility status  GP     [facility] HELEN 12/06/2012, 9:54 AM

## 2012-12-06 NOTE — Progress Notes (Signed)
Orthopedic Tech Progress Note Patient Details:  Diane Lin 05/26/1956 [id number]  Patient ID: Diane Lin, female   DOB: 07/14/1956, 57 y.o.   MRN: [id number]   Diane Lin 12/06/2012, 12:46 PMLumbar corset completed by bio-tech.

## 2012-12-06 NOTE — Progress Notes (Signed)
Occupational Therapy Evaluation Patient Details Name: Diane Lin MRN: [id number] DOB: 06/26/1956 Today's Date: 12/06/2012 Time: 7829-5621 OT Time Calculation (min): 43 min  OT Assessment / Plan / Recommendation Clinical Impression  The patient is a 57 year old female who had a TLIF L5-S1. Pt is very friendly and receptive to education. Pt needs mod cueing to keep precautions (Pt given BAT handout) Pt performs toileting and grooming at min guard, and transfers at min assist.    OT Assessment  Patient needs continued OT Services    Follow Up Recommendations  Home health OT       Equipment Recommendations  3 in 1 bedside comode       Frequency  Min 2X/week    Precautions / Restrictions Precautions Precautions: Fall;Back Precaution Booklet Issued: Yes (comment) Required Braces or Orthoses: Spinal Brace Spinal Brace: Applied in sitting position;Lumbar corset Restrictions Weight Bearing Restrictions: No   Pertinent Vitals/Pain Pt reports pain as 4/10    ADL  Eating/Feeding: Performed;Modified independent Where Assessed - Eating/Feeding: Chair Grooming: Performed;Wash/dry hands;Teeth care;Min guard Where Assessed - Grooming: Unsupported standing Lower Body Dressing: Performed;Set up Where Assessed - Lower Body Dressing: Unsupported sitting Toilet Transfer: Performed;Supervision/safety Toilet Transfer Method: Sit to Barista: Comfort height toilet;Grab bars Toileting - Clothing Manipulation and Hygiene: Performed;Supervision/safety Where Assessed - Engineer, mining and Hygiene: Standing Equipment Used: Back brace;Rolling walker;Gait belt Transfers/Ambulation Related to ADLs: Pt needed mod verbal cues to keep precautions ADL Comments: Pt was educated on set up (putting things on right to prevent twisting) and cup technique during teeth brushing to prevent bending. Pt slightly impulsive, and needs mod cueing to maintain precautions. Pt  performed toileting and peri care at supervision level. Hand washing and oral care standing unsupported at sink.    OT Diagnosis: Generalized weakness;Acute pain  OT Problem List: Decreased strength;Decreased safety awareness;Decreased knowledge of precautions OT Treatment Interventions: Self-care/ADL training;DME and/or AE instruction;Therapeutic activities;Patient/family education   OT Goals Acute Rehab OT Goals OT Goal Formulation: With patient Time For Goal Achievement: 12/20/12 Potential to Achieve Goals: Good ADL Goals Pt Will Perform Grooming: with modified independence;Unsupported;Standing at sink ADL Goal: Grooming - Progress: Goal set today Pt Will Perform Upper Body Bathing: with modified independence;Sitting at sink ADL Goal: Upper Body Bathing - Progress: Goal set today Pt Will Perform Lower Body Bathing: with modified independence;Sitting at sink ADL Goal: Lower Body Bathing - Progress: Goal set today Pt Will Perform Upper Body Dressing: with modified independence;Sitting, chair ADL Goal: Upper Body Dressing - Progress: Goal set today Pt Will Perform Lower Body Dressing: with modified independence;Sit to stand from chair ADL Goal: Lower Body Dressing - Progress: Goal set today Pt Will Transfer to Toilet: with modified independence;Regular height toilet;3-in-1 ADL Goal: Toilet Transfer - Progress: Goal set today Pt Will Perform Toileting - Clothing Manipulation: with modified independence;Standing ADL Goal: Toileting - Clothing Manipulation - Progress: Goal set today Pt Will Perform Toileting - Hygiene: with modified independence;Leaning right and/or left on 3-in-1/toilet ADL Goal: Toileting - Hygiene - Progress: Goal set today Pt Will Perform Tub/Shower Transfer: Tub transfer;with modified independence ADL Goal: Tub/Shower Transfer - Progress: Goal set today  Visit Information  Last OT Received On: 12/06/12 Assistance Needed: +1    Subjective Data  Subjective: Pt is  very sweet, and talkative, eager to learn, and a bit impulsive Patient Stated Goal: "I want to get in and out of my king bed safely"   Prior Functioning     Home Living  Lives With: Alone Available Help at Discharge: Family;Available 24 hours/day Type of Home: Apartment Home Access: Stairs to enter Entrance Stairs-Number of Steps: 15 steps Entrance Stairs-Rails: Left Home Layout: One level Bathroom Shower/Tub: Engineer, manufacturing systems: Standard Home Adaptive Equipment: Straight cane (from knee surgery 15 years ago) Prior Function Level of Independence: Independent Able to Take Stairs?: Yes Driving: Yes Vocation: Retired Musician: No difficulties Dominant Hand: Right            Cognition  Cognition Arousal/Alertness: Awake/alert Behavior During Therapy: WFL for tasks assessed/performed Overall Cognitive Status: Within Functional Limits for tasks assessed       Mobility Bed Mobility Bed Mobility: Rolling Right;Right Sidelying to Sit Rolling Right: 4: Min guard;With rail Right Sidelying to Sit: 4: Min guard;HOB flat;With rails Details for Bed Mobility Assistance: Pt was able to recall 3/3 precautions and performed log roll right at min guard. Pt used hand rails to assist. Pt needs mod cues for precautions. Transfers Transfers: Sit to Stand;Stand to Sit Sit to Stand: 4: Min assist;With upper extremity assist;From bed;From toilet Stand to Sit: 4: Min assist;With upper extremity assist;With armrests;To toilet;To chair/3-in-1 Details for Transfer Assistance: Pt needed mod cues for safe hand placement. Pt dependent on upper extremity and grab bars/arm rests for help during transitions           End of Session OT - End of Session Equipment Utilized During Treatment: Gait belt;Back brace (RW) Activity Tolerance: Patient tolerated treatment well Patient left: in chair;with call bell/phone within reach;with nursing in room Nurse Communication:  Mobility status;Precautions  GO     Sherryl Manges 12/06/2012, 2:43 PM

## 2012-12-06 NOTE — Progress Notes (Signed)
UR complete.  Edd Reppert RN, MSN 

## 2012-12-06 NOTE — Op Note (Signed)
NAMEDEION, FORGUE NO.:  [id number]  MEDICAL RECORD NO.:  [id number]  LOCATION:  [id number]                        FACILITY:  [facility]  PHYSICIAN:  Alvy Beal, MD    DATE OF BIRTH:  17-Feb-1956  DATE OF PROCEDURE:  12/05/2012 DATE OF DISCHARGE:                              OPERATIVE REPORT   PREOPERATIVE DIAGNOSIS:  Discogenic back pain with right radicular leg pain.  POSTOPERATIVE DIAGNOSIS:  Discogenic back pain with right radicular leg pain.  OPERATIVE PROCEDURE: 1. Gill decompression, right side L5-S1. 2. Complete diskectomy, L5-S1 with interposition of biomechanical     intervertebral device which is a size 12 x 31 Titan titanium banana     cage packed with DBX mix and local bone. 3. Posterolateral arthrodesis L5-S1 with graft and posterior segmental     pedicle screw fixation L5-S1.  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  Utilized the last level on diskography as L5-S1.  ASSISTANT:  This is a very pleasant woman, who I have been taking care for several years now.  She continued to have severe disabling back, buttock, and right leg pain.  Having failed long-standing conservative management, we elected to proceed with surgery.  All appropriate risks, benefits, and alternatives were discussed with the patient and consent was obtained.  OPERATIVE NOTE:  The patient was brought to the operating room, placed supine on the operating table.  After successful induction of general anesthesia and endotracheal intubation, TEDs, SCDs, and a Foley were inserted.  The patient was turned prone onto the Wilson frame.  The Neurostim representative then placed all appropriate intraoperative monitoring needles in place.  Once this was done, the back was prepped and draped in a standard fashion.  Time-out was then taken to confirm the patient, procedure,  and all other pertinent important data.  Once this was completed, the left iliac crest was palpated and 2 small stab incisions were made and the [employer] fixed points for intraoperative navigation were placed.  They had firm attachment into the iliac crest. Once this was secured, sterile drape was placed around the entire patient and the C-arm was brought into the field.  A 360 arc was then made degenerating intraoperative CT scan for navigation purposes.  Once this was done, I then confirmed the lateral border of the L5 and S1 pedicles.  I made small stab incisions and advanced the Jamshidi needle down to the transverse process of L5.  I confirmed trajectory in position on the CT scan and advanced the probe using CT navigation system through the pedicle and into the vertebral body.  I then placed guide pin and then repeated this procedure at the S1 level.  Once they were both down, I then tapped over the guide pin and then placed screws. At S1, I placed a 35 mm 8.0 diameter screw.  At L5, I placed a 7.0 diameter 40 mm screw.  I then stimulated both screws directly and there was no electrodiagnostic evidence of pedicle screw breach or abnormal EMG function.  At this point, I then went  to the contralateral side and made a incision approximately 2 inches in length, 2 fingerbreadths lateral to midline.  I dissected through the substantial adipose tissue down to the deep fascia.  Deep fascia was sharply incised and then I mobilized the paraspinal muscles to expose the posterolateral aspect of the 5-1 facet complex.  I then placed the retracting device into place and confirmed that I was at the appropriate level with an intraoperative lateral fluoro view.  Once I had confirmed this, I then performed a laminotomy of L5 and then removed the facet capsule of L5-S1.  I then removed the entire inferior L5 facet.  I then removed the pars of L5. At this point, I had the bulk of the bony decompression  completed.  I then removed the ligamentum flavum to expose the underlying thecal sac, S1 and L5 nerve roots.  Once these were identified, I then performed a foraminotomy of S1 and then removed all the osteophyte from the S1 superior facet until I could completely visualize the medial and the superior margins of the pedicle of S1.  I then placed retractors to protect the exiting and traversing nerve roots and thecal sac.  I then incised the disk space and then used a combination of pituitary rongeurs, curettes, and Kerrison rongeurs to remove all the disk material.  Once this was done, I placed some bone graft along the anterior annulus and then packed the cage appropriately. I then malleted the cage to appropriate depth.  Based on direct visualization and x-ray, the cage was within the vertebral body and was not contacting the thecal sac or the L5 or S1 nerve roots.  With the cage properly seated, I then irrigated copiously with normal saline, coagulated the epidural veins and ensured I had hemostasis.  I then again went back to the neuro navigation device and advanced the Jamshidi needle and at L5 and S1.  Not only did I use the neuromonitoring device, but I also used my anatomy since I could now visualize the majority of the S1 and L5 pedicles.  Once the probes were placed, I then tapped and placed 7.0 diameter screws.  At L5, I placed 40 and at S1 I placed 35. Both screws had excellent purchase and when stimulated, there was no abnormal EMG function and when I evaluated the pedicles, there was no evidence of breach.  At this point, I then irrigated the right wound copiously with normal saline and then obtained a 40 mm rod, secured into position and locked the rod to the pedicle screw construct.  I then made sure I had hemostasis, placed a thrombin-soaked Gelfoam patty over the exposed thecal sac and then closed the deep fascia with interrupted #1 Vicryl sutures, layer with 2-0 Vicryl  sutures and 3-0 Monocryl.  I then went back to the left-hand side and placed a 45 mm rod and locked it in the same fashion.  Once I had torqued according to manufacturer's standards, I then closed this side in a similar fashion.  It should be noted that prior to closure, I did copiously irrigate with normal saline.  At the end of the case, all needle and sponge counts were correct.  There was no abnormal activity on EMGs to suggest nerve injury.  Final x-rays of the cage and the pedicle screws demonstrated satisfactory position.  Final intraoperative CT scan was taken with another 360 cement and I could trace all 4 pedicles throughout their course and there was no evidence of breach.  At this point, the patient was then extubated.  Dry dressings were applied.  The patient was extubated and transferred to the PACU without incident.  At the end of the case, all needle and sponge counts were correct.  There was no adverse intraoperative events.     Alvy Beal, MD     DDB/MEDQ  D:  12/05/2012  T:  12/06/2012  Job:  [id number]

## 2012-12-06 NOTE — Progress Notes (Signed)
Orthopedic Tech Progress Note Patient Details:  Diane Lin [birth year] [id number]  Patient ID: [name], female   DOB: April 15, 1956, 57 y.o.   MRN: [id number]   Shawnie Pons 12/06/2012, 9:06 AM Called bio-tech for lumbar corset.

## 2012-12-06 NOTE — Progress Notes (Signed)
    Subjective: Procedure(s) (LRB): TLIF L5-S1 (N/A) 1 Day Post-Op  Patient reports pain as 4 on 0-10 scale.  Reports decreased leg pain reports incisional back pain   Foley just removed.  Patient has urge to void Negative bowel movement Positive flatus Negative chest pain or shortness of breath  Objective: Vital signs in last 24 hours: Temp:  [96.9 F (36.1 C)-97.8 F (36.6 C)] 97.8 F (36.6 C) (05/22 1610) Pulse Rate:  [60-76] 64 (05/22 0614) Resp:  [10-22] 16 (05/22 0614) BP: (131-166)/(66-99) 136/90 mmHg (05/22 0614) SpO2:  [95 %-100 %] 100 % (05/22 0614)  Intake/Output from previous day: 05/21 0701 - 05/22 0700 In: 3600 [I.V.:3100] Out: 1900 [Urine:1750; Blood:150]  Labs:  Recent Labs  12/03/12 1519  WBC 4.0  RBC 4.46  HCT 38.6  PLT 310    Recent Labs  12/03/12 1519  NA 140  K 3.6  CL 104  CO2 23  BUN 19  CREATININE 1.05  GLUCOSE 79  CALCIUM 8.9   No results found for this basename: LABPT, INR,  in the last 72 hours  Physical Exam: Neurologically intact ABD soft Neurovascular intact Intact pulses distally Dorsiflexion/Plantar flexion intact Incision: dressing C/D/I and no drainage Compartment soft  Assessment/Plan: Patient stable  xrays N/A Continue mobilization with physical therapy Continue care  Advance diet Up with therapy Plan for discharge tomorrow  Venita Lick, MD [facility] [phone]

## 2012-12-07 MED ORDER — OXYCODONE-ACETAMINOPHEN 10-325 MG PO TABS: 1.0000 | ORAL_TABLET | ORAL | Status: DC | PRN

## 2012-12-07 MED ORDER — DOCUSATE SODIUM 100 MG PO CAPS: 100.0000 mg | ORAL_CAPSULE | Freq: Three times a day (TID) | ORAL | Status: DC | PRN

## 2012-12-07 MED ORDER — METHOCARBAMOL 500 MG PO TABS: 500.0000 mg | ORAL_TABLET | Freq: Three times a day (TID) | ORAL | Status: DC | PRN

## 2012-12-07 MED ORDER — ONDANSETRON HCL 4 MG PO TABS: 4.0000 mg | ORAL_TABLET | Freq: Three times a day (TID) | ORAL | Status: DC | PRN

## 2012-12-07 NOTE — Progress Notes (Signed)
Occupational Therapy Treatment Patient Details Name: Diane Lin MRN: [id number] DOB: Nov 16, 1955 Today's Date: 12/07/2012 Time: 8657-8469 OT Time Calculation (min): 24 min  OT Assessment / Plan / Recommendation Comments on Treatment Session 57 y/o female s/p TLIF L5-S1. Pt discharging today. Pt able to recall 3/3 precautions, and transfer to shower chair with supervision. Pt would benefit from [facility] to reinforce precautions, and safety.    Follow Up Recommendations  Home health OT       Equipment Recommendations  Tub/shower seat;Other (comment);3 in 1 bedside comode (shower seat with back, RW)       Frequency Min 2X/week   Plan Discharge plan remains appropriate    Precautions / Restrictions Precautions Precautions: Fall;Back Precaution Booklet Issued: Yes (comment) Required Braces or Orthoses: Spinal Brace Spinal Brace: Applied in sitting position;Lumbar corset Restrictions Weight Bearing Restrictions: No   Pertinent Vitals/Pain No report of pain.    ADL  Grooming: Performed;Wash/dry hands Where Assessed - Grooming: Unsupported standing Toilet Transfer: Research scientist (life sciences) Method: Sit to Barista: Comfort height toilet;Grab bars Toileting - Architect and Hygiene: Performed;Supervision/safety Where Assessed - Engineer, mining and Hygiene: Standing Tub/Shower Transfer: Performed;Set up Tub/Shower Transfer Method: Other (comment) (shower chair with back) Tub/Shower Transfer Equipment: Shower seat with back Equipment Used: Back brace;Rolling walker;Gait belt Transfers/Ambulation Related to ADLs: Pt has incr awareness of RW but needed mod cues NOT to pull sit <>stand with RW. ADL Comments: Pt performing ADL at sink level, Pt recalled 3/3 back precautions and had everything set up on right to prevent twisting. Pt needed mod cueing on peri care after toileting.      OT Goals Acute Rehab OT Goals OT  Goal Formulation: With patient Time For Goal Achievement: 12/20/12 Potential to Achieve Goals: Good ADL Goals Pt Will Perform Grooming: with modified independence;Unsupported;Standing at sink ADL Goal: Grooming - Progress: Progressing toward goals Pt Will Perform Upper Body Bathing: with modified independence;Sitting at sink Pt Will Perform Lower Body Bathing: with modified independence;Sitting at sink Pt Will Perform Upper Body Dressing: with modified independence;Sitting, chair Pt Will Perform Lower Body Dressing: with modified independence;Sit to stand from chair Pt Will Transfer to Toilet: with modified independence;Regular height toilet;3-in-1 ADL Goal: Toilet Transfer - Progress: Progressing toward goals Pt Will Perform Toileting - Clothing Manipulation: with modified independence;Standing ADL Goal: Toileting - Clothing Manipulation - Progress: Progressing toward goals Pt Will Perform Toileting - Hygiene: with modified independence;Leaning right and/or left on 3-in-1/toilet ADL Goal: Toileting - Hygiene - Progress: Progressing toward goals Pt Will Perform Tub/Shower Transfer: Tub transfer;with modified independence ADL Goal: Tub/Shower Transfer - Progress: Progressing toward goals  Visit Information  Last OT Received On: 12/07/12 Assistance Needed: +1          Cognition  Cognition Arousal/Alertness: Awake/alert Behavior During Therapy: WFL for tasks assessed/performed Overall Cognitive Status: Within Functional Limits for tasks assessed    Mobility  Bed Mobility Bed Mobility: Left Sidelying to Sit;Sit to Sidelying Left;Sitting - Scoot to Edge of Bed Left Sidelying to Sit: 5: Supervision Sitting - Scoot to Edge of Bed: 6: Modified independent (Device/Increase time) Sit to Sidelying Left: HOB flat;6: Modified independent (Device/Increase time) Details for Bed Mobility Assistance: pt able to verbally sequence through all bed mobility, verbal cues for safe sequence  sidelying->sit Transfers Transfers: Sit to Stand;Stand to Sit Sit to Stand: 6: Modified independent (Device/Increase time);From chair/3-in-1;With upper extremity assist Stand to Sit: 6: Modified independent (Device/Increase time);To bed;To chair/3-in-1;With upper extremity assist Details for Transfer Assistance:  good demonstration of safe technique          End of Session OT - End of Session Equipment Utilized During Treatment: Gait belt;Back brace (RW, Shower chair with back) Activity Tolerance: Patient tolerated treatment well Patient left: in chair;with call bell/phone within reach;with nursing in room Nurse Communication: Mobility status;Precautions  GO     Sherryl Manges 12/07/2012, 9:56 AM

## 2012-12-07 NOTE — Progress Notes (Addendum)
Physical Therapy Treatment and Discharge Patient Details Name: Diane Lin MRN: [id number] DOB: Mar 29, 1956 Today's Date: 12/07/2012 Time: 1191-4782 PT Time Calculation (min): 30 min  PT Assessment / Plan / Recommendation Comments on Treatment Session  57 y/o female s/p TLIF L5-S1, 2 Day Post-Op . Plan is for d/c home today. Patient is close to modified independent with mobility. Verbalizing all her precautions with min cueing. Will have assist from family at home. Needs HHPT to reinforce safety at home given she lives alone. No further acute PT needed at this time.     Follow Up Recommendations  Home health PT     Does the patient have the potential to tolerate intense rehabilitation     Barriers to Discharge        Equipment Recommendations  Rolling walker with 5" wheels    Recommendations for Other Services    Frequency Min 5X/week   Plan Discharge plan remains appropriate;All goals met and education completed, patient dischaged from PT services    Precautions / Restrictions Precautions Precautions: Fall;Back Precaution Booklet Issued: Yes (comment) Required Braces or Orthoses: Spinal Brace Spinal Brace: Applied in sitting position;Lumbar corset Restrictions Weight Bearing Restrictions: No   Pertinent Vitals/Pain Reports 6/10 pain    Mobility  Bed Mobility Bed Mobility: Left Sidelying to Sit;Sit to Sidelying Left;Sitting - Scoot to Edge of Bed Left Sidelying to Sit: 5: Supervision Sitting - Scoot to Edge of Bed: 6: Modified independent (Device/Increase time) Sit to Sidelying Left: HOB flat;6: Modified independent (Device/Increase time) Details for Bed Mobility Assistance: pt able to verbally sequence through all bed mobility, verbal cues for safe sequence sidelying->sit Transfers Transfers: Sit to Stand;Stand to Sit Sit to Stand: 6: Modified independent (Device/Increase time);From chair/3-in-1;With upper extremity assist Stand to Sit: 6: Modified independent  (Device/Increase time);To bed;To chair/3-in-1;With upper extremity assist Details for Transfer Assistance: good demonstration of safe technique Ambulation/Gait Ambulation/Gait Assistance: 5: Supervision Ambulation Distance (Feet): 200 Feet Assistive device: Rolling walker Ambulation/Gait Assistance Details: verbal cues for safe positioning with RW Gait Pattern: Step-through pattern;Decreased stride length Gait velocity: decreased General Gait Details: very cautious pace Stairs: Yes Stairs Assistance: 5: Supervision Stairs Assistance Details (indicate cue type and reason): verbal cues for safe pace and technique Stair Management Technique: One rail Right;Forwards;Step to pattern Number of Stairs: 12      PT Goals Acute Rehab PT Goals Pt will Roll Supine to Left Side: with modified independence PT Goal: Rolling Supine to Left Side - Progress: Met PT Goal: Supine/Side to Sit - Progress: Progressing toward goal PT Goal: Sit to Supine/Side - Progress: Met PT Goal: Sit to Stand - Progress: Met PT Goal: Stand to Sit - Progress: Met PT Goal: Ambulate - Progress: Met PT Goal: Up/Down Stairs - Progress: Met Additional Goals PT Goal: Additional Goal #1 - Progress: Progressing toward goal  Visit Information  Last PT Received On: 12/07/12 Assistance Needed: +1    Subjective Data  Subjective: You all have done a great job educating me.  Patient Stated Goal: home today   Cognition  Cognition Arousal/Alertness: Awake/alert Behavior During Therapy: WFL for tasks assessed/performed Overall Cognitive Status: Within Functional Limits for tasks assessed    Balance     End of Session PT - End of Session Equipment Utilized During Treatment: Gait belt Activity Tolerance: Patient tolerated treatment well Patient left: in chair;with call bell/phone within reach Nurse Communication: Mobility status   GP     [facility] HELEN 12/07/2012, 9:52 AM

## 2012-12-07 NOTE — Progress Notes (Signed)
Patient ready for discharge whenever her equipments are delivered to her by [facility], assessments remained unchanged at this time. Discharge and medication instructions given and all questions answered. Will be wheeled down whenever ready.

## 2012-12-07 NOTE — Progress Notes (Signed)
I agree with the following treatment note after reviewing documentation.   Johnston, Aayat Hajjar Brynn   OTR/L Pager: [phone] Office: [phone] .   

## 2012-12-07 NOTE — Progress Notes (Signed)
    Subjective: Procedure(s) (LRB): TLIF L5-S1 (N/A) 2 Days Post-Op  Patient reports pain as 3 on 0-10 scale.  Reports decreased leg pain reports incisional back pain   Positive void Negative bowel movement Positive flatus Negative chest pain or shortness of breath  Objective: Vital signs in last 24 hours: Temp:  [97.7 F (36.5 C)-98.2 F (36.8 C)] 98.1 F (36.7 C) (05/23 0629) Pulse Rate:  [56-69] 56 (05/23 0629) Resp:  [18-20] 18 (05/23 0629) BP: (104-170)/(60-76) 153/70 mmHg (05/23 0629) SpO2:  [94 %-100 %] 97 % (05/23 0629)  Intake/Output from previous day: 05/22 0701 - 05/23 0700 In: 960 [P.O.:960] Out: 100 [Urine:100]  Labs: No results found for this basename: WBC, RBC, HCT, PLT,  in the last 72 hours No results found for this basename: NA, K, CL, CO2, BUN, CREATININE, GLUCOSE, CALCIUM,  in the last 72 hours No results found for this basename: LABPT, INR,  in the last 72 hours  Physical Exam: Neurologically intact ABD soft Neurovascular intact Intact pulses distally Dorsiflexion/Plantar flexion intact Incision: dressing C/D/I and no drainage Compartment soft  Assessment/Plan: Patient stable  xrays n/a  Continue mobilization with physical therapy Continue care  Up with therapy Discharge home with home health Patient doing well Ok for d/c after cleared by PT   Venita Lick, MD [facility] [phone]

## 2012-12-07 NOTE — Discharge Summary (Signed)
Patient ID: Diane Lin MRN: [id number] DOB/AGE: 10-04-1955 57 y.o.  Admit date: 12/05/2012 Discharge date: 12/07/2012  Admission Diagnoses:  Active Problems:   * No active hospital problems. *   Discharge Diagnoses:  Active Problems:   * No active hospital problems. *  status post Procedure(s): TLIF L5-S1  Past Medical History  Diagnosis Date  . Complication of anesthesia   . PONV (postoperative nausea and vomiting)   . Depression   . Anxiety     related to so much pain, pt. reports PCP manages her meds for depression & anxiety    . Hypertension     managed by PCP  . H/O hiatal hernia   . GERD (gastroesophageal reflux disease)   . Arthritis     in back & R knee  . Fibromyalgia     diag. by Dr. Harlow Mares in [place], internal medicine    Surgeries: Procedure(s): TLIF L5-S1 on 12/05/2012   Consultants:  none  Discharged Condition: Improved  Hospital Course: Diane Lin is an 57 y.o. female who was admitted 12/05/2012 for operative treatment of <principal problem not specified>. Patient failed conservative treatments (please see the history and physical for the specifics) and had severe unremitting pain that affects sleep, daily activities and work/hobbies. After pre-op clearance, the patient was taken to the operating room on 12/05/2012 and underwent  Procedure(s): TLIF L5-S1.    Patient was given perioperative antibiotics: Anti-infectives   Start     Dose/Rate Route Frequency Ordered Stop   12/06/12 0000  ceFAZolin (ANCEF) IVPB 1 g/50 mL premix     1 g 100 mL/hr over 30 Minutes Intravenous Every 8 hours 12/05/12 2048 12/06/12 0955   12/05/12 1545  ceFAZolin (ANCEF) IVPB 2 g/50 mL premix  Status:  Discontinued     2 g 100 mL/hr over 30 Minutes Intravenous To Surgery 12/05/12 1532 12/05/12 2024   12/05/12 1531  ceFAZolin (ANCEF) IVPB 2 g/50 mL premix  Status:  Discontinued     2 g 100 mL/hr over 30 Minutes Intravenous 30 min pre-op 12/05/12 1531 12/05/12 1531   12/04/12 1426  ceFAZolin (ANCEF) IVPB 2 g/50 mL premix     2 g 100 mL/hr over 30 Minutes Intravenous 30 min pre-op 12/04/12 1426 12/05/12 1610       Patient was given sequential compression devices and early ambulation to prevent DVT.   Patient benefited maximally from hospital stay and there were no complications. At the time of discharge, the patient was urinating/moving their bowels without difficulty, tolerating a regular diet, pain is controlled with oral pain medications and they have been cleared by PT/OT.   Recent vital signs: Patient Vitals for the past 24 hrs:  BP Temp Temp src Pulse Resp SpO2  12/07/12 0629 153/70 mmHg 98.1 F (36.7 C) Oral 56 18 97 %  12/07/12 0030 151/60 mmHg - - 61 - -  12/06/12 2200 170/68 mmHg 97.7 F (36.5 C) Oral 69 20 98 %  12/06/12 1740 139/66 mmHg 97.9 F (36.6 C) Oral 60 18 100 %  12/06/12 1358 104/60 mmHg 98 F (36.7 C) Oral 60 18 98 %  12/06/12 1000 118/76 mmHg 98.2 F (36.8 C) Oral 62 18 94 %     Recent laboratory studies: No results found for this basename: WBC, HGB, HCT, PLT, NA, K, CL, CO2, BUN, CREATININE, GLUCOSE, PT, INR, CALCIUM, 2,  in the last 72 hours   Discharge Medications:     Medication List    STOP  taking these medications       acetaminophen 500 MG tablet  Commonly known as:  TYLENOL     HYDROcodone-acetaminophen 5-325 MG per tablet  Commonly known as:  NORCO/VICODIN      TAKE these medications       ALPRAZolam 0.5 MG tablet  Commonly known as:  XANAX  Take 0.5 mg by mouth 4 (four) times daily.     atorvastatin 40 MG tablet  Commonly known as:  LIPITOR  Take 40 mg by mouth daily before breakfast.     benzocaine 10 % mucosal gel  Commonly known as:  ORAJEL  Use as directed 1 application in the mouth or throat daily as needed (when she puts in her dentures.).     cetirizine 10 MG tablet  Commonly known as:  ZYRTEC  Take 10 mg by mouth daily.     docusate sodium 100 MG capsule  Commonly known as:   COLACE  Take 1 capsule (100 mg total) by mouth 3 (three) times daily as needed for constipation.     EQ ALLERGY/SINUS PO  Take 1 tablet by mouth daily as needed. For allergy symptoms     estradiol 1 MG tablet  Commonly known as:  ESTRACE  Take 1 mg by mouth daily before breakfast.     gabapentin 100 MG capsule  Commonly known as:  NEURONTIN  Take 100 mg by mouth 3 (three) times daily.     hydrochlorothiazide 12.5 MG capsule  Commonly known as:  MICROZIDE  Take 12.5 mg by mouth every morning.     lansoprazole 30 MG capsule  Commonly known as:  PREVACID  Take 30 mg by mouth daily before breakfast.     methocarbamol 500 MG tablet  Commonly known as:  ROBAXIN  Take 1 tablet (500 mg total) by mouth 3 (three) times daily as needed.     metoprolol succinate 50 MG 24 hr tablet  Commonly known as:  TOPROL-XL  Take 50 mg by mouth every morning. Take with or immediately following a meal.     ondansetron 4 MG tablet  Commonly known as:  ZOFRAN  Take 1 tablet (4 mg total) by mouth every 8 (eight) hours as needed for nausea.     oxyCODONE-acetaminophen 10-325 MG per tablet  Commonly known as:  PERCOCET  Take 1 tablet by mouth every 4 (four) hours as needed for pain.     polyethylene glycol packet  Commonly known as:  MIRALAX / GLYCOLAX  Take 17 g by mouth daily as needed. For constipation     REFRESH OP  Place 1 drop into both eyes 3 (three) times daily.     varenicline 0.5 MG X 11 & 1 MG X 42 tablet  Commonly known as:  CHANTIX PAK  Take by mouth 2 (two) times daily. Take one 0.5 mg tablet by mouth once daily for 3 days, then increase to one 0.5 mg tablet twice daily for 4 days, then increase to one 1 mg tablet twice daily.     zolpidem 10 MG tablet  Commonly known as:  AMBIEN  Take 10 mg by mouth at bedtime as needed for sleep. For sleep        Diagnostic Studies: Dg Chest 2 View  12/03/2012   *RADIOLOGY REPORT*  Clinical Data: Hypertension  CHEST - 2 VIEW  Comparison:  May 19, 2010.  Findings: Cardiomediastinal silhouette appears normal.  No acute pulmonary disease is noted.  Bony thorax is intact.  IMPRESSION:  No acute cardiopulmonary abnormality seen.   Original Report Authenticated By: Lupita Raider.,  M.D.   Dg Lumbar Spine 2-3 Views  12/06/2012   *RADIOLOGY REPORT*  Clinical Data: L5-S1 TLIF  LUMBAR SPINE - 2-3 VIEW,DG C-ARM 61-120 MIN  Technique:  C-arm fluoroscopic images were obtained intraoperatively and submitted for postoperative interpretation. Please see the performing provider's procedural report for the fluoroscopy time utilized.  Fluoroscopy time:  1 minute 30 seconds  Comparison: Lumbar spine radiographs dated 12/03/2012  Findings: Three fluoroscopic spot images were provided during L5-S1 TLIF.  When correlating with prior lumbar spine radiographs, there were six lumbar-type vertebral bodies, and the operative level was numbered L5-L6 on that study.  Surgical hardware is in satisfactory position.  IMPRESSION: Intraoperative fluoroscopic images during L5-S1 TLIF, as described above.   Original Report Authenticated By: Charline Bills, M.D.   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  For the purposes of this dictation, lumbar levels will be labeled L1- L6.  Normal alignment of the lumbar spine.  No definite anterolisthesis or retrolisthesis.  Vertebral body heights appear preserved.  Grossly unchanged small Schmorl's node formation involving the inferior endplate of L5.  Redemonstrated mild multilevel lumbar spine DDD, likely worse at L3 - L4 and L4 - L5 with disc space height loss, end plate irregularity and sclerosis.  Limited visualization of the  bilateral SI joints is normal.  Regional bowel gas pattern and soft tissues are normal.  IMPRESSION: 1.  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  For the purposes of this dictation, lumbar levels were labeled L1 - L6.  2. Grossly unchanged mild multilevel lumbar spine DDD, likely worse at L3 - L4 and L4 - L5.   Original Report Authenticated By: Tacey Ruiz, MD   Dg C-arm [phone] Min  12/06/2012   *RADIOLOGY REPORT*  Clinical Data: L5-S1 TLIF  LUMBAR SPINE - 2-3 VIEW,DG C-ARM 61-120 MIN  Technique:  C-arm fluoroscopic images were obtained intraoperatively and submitted for postoperative interpretation. Please see the performing provider's procedural report for the fluoroscopy time utilized.  Fluoroscopy time:  1 minute 30 seconds  Comparison: Lumbar spine radiographs dated 12/03/2012  Findings: Three fluoroscopic spot images were provided during L5-S1 TLIF.  When correlating with prior lumbar spine radiographs, there were six lumbar-type vertebral bodies, and the operative level was numbered L5-L6 on that study.  Surgical hardware is in satisfactory position.  IMPRESSION: Intraoperative fluoroscopic images during L5-S1 TLIF, as described above.   Original Report Authenticated By: Charline Bills, M.D.          Follow-up Information   Follow up with Alvy Beal, MD. Call in 2 weeks. (As needed if symptoms worsen)    Contact information:   [address] [place] [address] [phone]      Discharge Plan:  discharge to home.  Patient stable  Disposition: home with HHS    Signed: [place] Latka D for Dr. Venita Lick [facility] [phone] 12/07/2012, 7:50 AM

## 2012-12-07 NOTE — Plan of Care (Signed)
Problem: Consults Goal: Diagnosis - Spinal Surgery Outcome: Completed/Met Date Met:  12/07/12 Thoraco/Lumbar Spine Fusion

## 2012-12-07 NOTE — Care Management Note (Unsigned)
    Page 1 of 2   12/07/2012     11:45:26 AM   CARE MANAGEMENT NOTE 12/07/2012  Patient:  Diane Lin, Diane Lin   Account Number:  [id number]  Date Initiated:  12/06/2012  Documentation initiated by:  Elmer Bales  Subjective/Objective Assessment:   Pt admitted for L5-S1 TLIF.  Pt lives home alone     Action/Plan:   PT/OT eval. PT recommending home health with home PT and a rolling walker   Anticipated DC Date:  12/10/2012   Anticipated DC Plan:  HOME W HOME HEALTH SERVICES      DC Planning Services  CM consult      Choice offered to / List presented to:  C-1 Patient   DME arranged  Levan Hurst      DME agency  [facility]     HH arranged  HH-3 OT  HH-2 PT      [facility] agency  [facility]   Status of service:  Completed, signed off Medicare Important Message given?   (If response is "NO", the following Medicare IM given date fields will be blank) Date Medicare IM given:   Date Additional Medicare IM given:    Discharge Disposition:  HOME W HOME HEALTH SERVICES  Per UR Regulation:  Reviewed for med. necessity/level of care/duration of stay  If discussed at Long Length of Stay Meetings, dates discussed:    Comments:  12/07/12 1030 Elmer Bales RN, MSN-  Met with patient to discuss discharge plans.  Pt chose [facility] for PT/OT services, Hilda Lias was notified and has accepted referral. [facility] DME was notified of need for rolling walker prior to discharge. Pt declined a 3-N-1.

## 2013-03-04 ENCOUNTER — Ambulatory Visit (INDEPENDENT_AMBULATORY_CARE_PROVIDER_SITE_OTHER): Payer: BC Managed Care – PPO

## 2013-03-04 ENCOUNTER — Other Ambulatory Visit: Payer: Self-pay | Admitting: Orthopedic Surgery

## 2013-03-04 DIAGNOSIS — M545 Low back pain, unspecified: Secondary | ICD-10-CM

## 2013-03-04 DIAGNOSIS — Z981 Arthrodesis status: Secondary | ICD-10-CM

## 2013-11-29 ENCOUNTER — Encounter (HOSPITAL_BASED_OUTPATIENT_CLINIC_OR_DEPARTMENT_OTHER): Payer: Self-pay | Admitting: *Deleted

## 2013-11-29 ENCOUNTER — Other Ambulatory Visit: Payer: Self-pay | Admitting: Orthopedic Surgery

## 2013-12-02 ENCOUNTER — Encounter (HOSPITAL_BASED_OUTPATIENT_CLINIC_OR_DEPARTMENT_OTHER): Payer: Self-pay | Admitting: *Deleted

## 2013-12-02 NOTE — Progress Notes (Signed)
NPO AFTER MN. ARRIVE AT 1000. NEEDS ISTAT AND EKG. WILL TAKE GABAPENTIN, PRILOSEC, METOPROLOL , AND ESTRADIOL AM DOS W/ SIPS OF WATER.

## 2013-12-05 ENCOUNTER — Ambulatory Visit (HOSPITAL_BASED_OUTPATIENT_CLINIC_OR_DEPARTMENT_OTHER)
Admission: RE | Admit: 2013-12-05 | Discharge: 2013-12-05 | Disposition: A | Discharge status: Home / Self Care | Payer: 59 | Source: Ambulatory Visit | Attending: Specialist | Admitting: Specialist

## 2013-12-05 ENCOUNTER — Other Ambulatory Visit: Payer: Self-pay

## 2013-12-05 ENCOUNTER — Encounter (HOSPITAL_BASED_OUTPATIENT_CLINIC_OR_DEPARTMENT_OTHER): Payer: 59 | Admitting: Certified Registered"

## 2013-12-05 ENCOUNTER — Encounter (HOSPITAL_BASED_OUTPATIENT_CLINIC_OR_DEPARTMENT_OTHER)
Admission: RE | Disposition: A | Discharge status: Home / Self Care | Payer: Self-pay | Source: Ambulatory Visit | Attending: Specialist

## 2013-12-05 ENCOUNTER — Ambulatory Visit (HOSPITAL_BASED_OUTPATIENT_CLINIC_OR_DEPARTMENT_OTHER): Payer: 59 | Admitting: Certified Registered"

## 2013-12-05 ENCOUNTER — Encounter (HOSPITAL_BASED_OUTPATIENT_CLINIC_OR_DEPARTMENT_OTHER): Payer: Self-pay | Admitting: *Deleted

## 2013-12-05 DIAGNOSIS — Z9889 Other specified postprocedural states: Secondary | ICD-10-CM

## 2013-12-05 DIAGNOSIS — K449 Diaphragmatic hernia without obstruction or gangrene: Secondary | ICD-10-CM | POA: Insufficient documentation

## 2013-12-05 DIAGNOSIS — I10 Essential (primary) hypertension: Secondary | ICD-10-CM | POA: Insufficient documentation

## 2013-12-05 DIAGNOSIS — Z87891 Personal history of nicotine dependence: Secondary | ICD-10-CM | POA: Insufficient documentation

## 2013-12-05 DIAGNOSIS — K219 Gastro-esophageal reflux disease without esophagitis: Secondary | ICD-10-CM | POA: Insufficient documentation

## 2013-12-05 DIAGNOSIS — IMO0002 Reserved for concepts with insufficient information to code with codable children: Secondary | ICD-10-CM | POA: Insufficient documentation

## 2013-12-05 DIAGNOSIS — S83289A Other tear of lateral meniscus, current injury, unspecified knee, initial encounter: Secondary | ICD-10-CM | POA: Insufficient documentation

## 2013-12-05 DIAGNOSIS — X58XXXA Exposure to other specified factors, initial encounter: Secondary | ICD-10-CM | POA: Insufficient documentation

## 2013-12-05 DIAGNOSIS — IMO0001 Reserved for inherently not codable concepts without codable children: Secondary | ICD-10-CM | POA: Insufficient documentation

## 2013-12-05 DIAGNOSIS — M1710 Unilateral primary osteoarthritis, unspecified knee: Secondary | ICD-10-CM | POA: Insufficient documentation

## 2013-12-05 DIAGNOSIS — F411 Generalized anxiety disorder: Secondary | ICD-10-CM | POA: Insufficient documentation

## 2013-12-05 HISTORY — DX: Dorsalgia, unspecified: M54.9

## 2013-12-05 HISTORY — PX: KNEE ARTHROSCOPY WITH MEDIAL MENISECTOMY: SHX5651

## 2013-12-05 HISTORY — DX: Osteoarthritis of knee, unspecified: M17.9

## 2013-12-05 HISTORY — DX: Presence of spectacles and contact lenses: Z97.3

## 2013-12-05 HISTORY — DX: Other chronic pain: G89.29

## 2013-12-05 HISTORY — DX: Unilateral primary osteoarthritis, unspecified knee: M17.10

## 2013-12-05 LAB — POCT I-STAT, CHEM 8
BUN: 18 mg/dL (ref 6–23)
CHLORIDE: 100 meq/L (ref 96–112)
CREATININE: 1.2 mg/dL — AB (ref 0.50–1.10)
Calcium, Ion: 1.26 mmol/L (ref 1.13–1.30)
GLUCOSE: 102 mg/dL — AB (ref 70–99)
HCT: 45 % (ref 36.0–46.0)
Hemoglobin: 15.3 g/dL — ABNORMAL HIGH (ref 12.0–15.0)
POTASSIUM: 3.4 meq/L — AB (ref 3.7–5.3)
SODIUM: 144 meq/L (ref 137–147)
TCO2: 26 mmol/L (ref 0–100)

## 2013-12-05 SURGERY — ARTHROSCOPY, KNEE, WITH MEDIAL MENISCECTOMY
Anesthesia: General | Site: Knee | Laterality: Right

## 2013-12-05 MED ORDER — LIDOCAINE HCL (CARDIAC) 20 MG/ML IV SOLN
INTRAVENOUS | Status: DC | PRN
  Administered 2013-12-05: 60 mg via INTRAVENOUS

## 2013-12-05 MED ORDER — HYDROCODONE-ACETAMINOPHEN 5-325 MG PO TABS
ORAL_TABLET | ORAL | Status: AC
Start: 2013-12-05 — End: 2013-12-05
  Filled 2013-12-05: qty 1

## 2013-12-05 MED ORDER — EPHEDRINE SULFATE 50 MG/ML IJ SOLN
INTRAMUSCULAR | Status: DC | PRN
  Administered 2013-12-05: 10 mg via INTRAVENOUS

## 2013-12-05 MED ORDER — SODIUM CHLORIDE 0.9 % IR SOLN
Status: DC | PRN
  Administered 2013-12-05: 6000 mL

## 2013-12-05 MED ORDER — POVIDONE-IODINE 7.5 % EX SOLN
Freq: Once | CUTANEOUS | Status: DC
  Filled 2013-12-05: qty 118

## 2013-12-05 MED ORDER — ONDANSETRON HCL 4 MG/2ML IJ SOLN
INTRAMUSCULAR | Status: DC | PRN
  Administered 2013-12-05: 4 mg via INTRAVENOUS

## 2013-12-05 MED ORDER — FENTANYL CITRATE 0.05 MG/ML IJ SOLN
25.0000 ug | INTRAMUSCULAR | Status: DC | PRN
  Administered 2013-12-05: 25 ug via INTRAVENOUS
  Filled 2013-12-05: qty 1

## 2013-12-05 MED ORDER — LACTATED RINGERS IV SOLN
INTRAVENOUS | Status: DC
  Filled 2013-12-05: qty 1000

## 2013-12-05 MED ORDER — SODIUM CHLORIDE 0.9 % IV SOLN
INTRAVENOUS | Status: DC
  Filled 2013-12-05: qty 1000

## 2013-12-05 MED ORDER — FENTANYL CITRATE 0.05 MG/ML IJ SOLN
INTRAMUSCULAR | Status: AC
Start: 2013-12-05 — End: 2013-12-05
  Filled 2013-12-05: qty 2

## 2013-12-05 MED ORDER — MORPHINE SULFATE 4 MG/ML IJ SOLN
INTRAMUSCULAR | Status: AC
  Filled 2013-12-05: qty 1

## 2013-12-05 MED ORDER — HYDROCODONE-ACETAMINOPHEN 5-325 MG PO TABS: 1.0000 | ORAL_TABLET | ORAL | Status: DC | PRN

## 2013-12-05 MED ORDER — MIDAZOLAM HCL 5 MG/5ML IJ SOLN
INTRAMUSCULAR | Status: DC | PRN
  Administered 2013-12-05: 2 mg via INTRAVENOUS

## 2013-12-05 MED ORDER — FENTANYL CITRATE 0.05 MG/ML IJ SOLN
INTRAMUSCULAR | Status: AC
  Filled 2013-12-05: qty 6

## 2013-12-05 MED ORDER — MIDAZOLAM HCL 2 MG/2ML IJ SOLN
INTRAMUSCULAR | Status: AC
  Filled 2013-12-05: qty 2

## 2013-12-05 MED ORDER — LACTATED RINGERS IV SOLN
INTRAVENOUS | Status: DC
  Administered 2013-12-05 (×2): via INTRAVENOUS
  Filled 2013-12-05: qty 1000

## 2013-12-05 MED ORDER — CEPHALEXIN 500 MG PO CAPS: 500.0000 mg | ORAL_CAPSULE | Freq: Three times a day (TID) | ORAL | Status: DC

## 2013-12-05 MED ORDER — HYDROCODONE-ACETAMINOPHEN 5-325 MG PO TABS
1.0000 | ORAL_TABLET | Freq: Once | ORAL | Status: AC
  Administered 2013-12-05: 1 via ORAL
  Filled 2013-12-05: qty 1

## 2013-12-05 MED ORDER — PROPOFOL 10 MG/ML IV BOLUS
INTRAVENOUS | Status: DC | PRN
  Administered 2013-12-05: 180 mg via INTRAVENOUS

## 2013-12-05 MED ORDER — DEXAMETHASONE SODIUM PHOSPHATE 4 MG/ML IJ SOLN
INTRAMUSCULAR | Status: DC | PRN
  Administered 2013-12-05: 10 mg via INTRAVENOUS

## 2013-12-05 MED ORDER — CEFAZOLIN SODIUM-DEXTROSE 2-3 GM-% IV SOLR
2.0000 g | INTRAVENOUS | Status: AC
  Administered 2013-12-05: 2 g via INTRAVENOUS
  Filled 2013-12-05: qty 50

## 2013-12-05 MED ORDER — FENTANYL CITRATE 0.05 MG/ML IJ SOLN
INTRAMUSCULAR | Status: DC | PRN
  Administered 2013-12-05: 25 ug via INTRAVENOUS
  Administered 2013-12-05: 50 ug via INTRAVENOUS
  Administered 2013-12-05: 25 ug via INTRAVENOUS

## 2013-12-05 SURGICAL SUPPLY — 50 items
BANDAGE ESMARK 6X9 LF (GAUZE/BANDAGES/DRESSINGS) ×1 IMPLANT
BANDAGE GAUZE ELAST BULKY 4 IN (GAUZE/BANDAGES/DRESSINGS) ×2 IMPLANT
BLADE 4.2CUDA (BLADE) IMPLANT
BLADE CUDA GRT WHITE 3.5 (BLADE) ×2 IMPLANT
BLADE CUDA SHAVER 3.5 (BLADE) IMPLANT
BNDG ESMARK 6X9 LF (GAUZE/BANDAGES/DRESSINGS) ×2
BNDG GAUZE ELAST 4 BULKY (GAUZE/BANDAGES/DRESSINGS) ×4 IMPLANT
CANISTER SUCT LVC 12 LTR MEDI- (MISCELLANEOUS) ×4 IMPLANT
CANISTER SUCTION 1200CC (MISCELLANEOUS) ×2 IMPLANT
CLOTH BEACON ORANGE TIMEOUT ST (SAFETY) ×2 IMPLANT
CUFF TOURNIQUET SINGLE 34IN LL (TOURNIQUET CUFF) ×2 IMPLANT
DRAPE ARTHROSCOPY W/POUCH 114 (DRAPES) ×2 IMPLANT
DRAPE INCISE 23X17 IOBAN STRL (DRAPES)
DRAPE INCISE IOBAN 23X17 STRL (DRAPES) IMPLANT
DRAPE INCISE IOBAN 66X45 STRL (DRAPES) ×2 IMPLANT
DURAPREP 26ML APPLICATOR (WOUND CARE) ×2 IMPLANT
ELECT MENISCUS 165MM 90D (ELECTRODE) IMPLANT
ELECT REM PT RETURN 9FT ADLT (ELECTROSURGICAL)
ELECTRODE REM PT RTRN 9FT ADLT (ELECTROSURGICAL) IMPLANT
GAUZE XEROFORM 1X8 LF (GAUZE/BANDAGES/DRESSINGS) ×2 IMPLANT
GLOVE BIO SURGEON STRL SZ7.5 (GLOVE) ×4 IMPLANT
GLOVE INDICATOR 7.5 STRL GRN (GLOVE) ×4 IMPLANT
GLOVE INDICATOR 8.0 STRL GRN (GLOVE) ×4 IMPLANT
GLOVE SURG ORTHO 8.0 STRL STRW (GLOVE) ×2 IMPLANT
GOWN PREVENTION PLUS LG XLONG (DISPOSABLE) IMPLANT
GOWN STRL REIN XL XLG (GOWN DISPOSABLE) IMPLANT
GOWN STRL REUS W/ TWL XL LVL3 (GOWN DISPOSABLE) ×3 IMPLANT
GOWN STRL REUS W/TWL XL LVL3 (GOWN DISPOSABLE) ×3
IMMOBILIZER KNEE 22 UNIV (SOFTGOODS) IMPLANT
IMMOBILIZER KNEE 24 THIGH 36 (MISCELLANEOUS) IMPLANT
IMMOBILIZER KNEE 24 UNIV (MISCELLANEOUS)
KNEE WRAP E Z 3 GEL PACK (MISCELLANEOUS) ×2 IMPLANT
MINI VAC (SURGICAL WAND) ×2 IMPLANT
NEEDLE HYPO 22GX1.5 SAFETY (NEEDLE) ×2 IMPLANT
PACK ARTHROSCOPY DSU (CUSTOM PROCEDURE TRAY) ×2 IMPLANT
PACK BASIN DAY SURGERY FS (CUSTOM PROCEDURE TRAY) ×2 IMPLANT
PAD ABD 8X10 STRL (GAUZE/BANDAGES/DRESSINGS) ×4 IMPLANT
PADDING CAST ABS 4INX4YD NS (CAST SUPPLIES) ×1
PADDING CAST ABS COTTON 4X4 ST (CAST SUPPLIES) ×1 IMPLANT
PENCIL BUTTON HOLSTER BLD 10FT (ELECTRODE) IMPLANT
SET ARTHROSCOPY TUBING (MISCELLANEOUS) ×1
SET ARTHROSCOPY TUBING LN (MISCELLANEOUS) ×1 IMPLANT
SPONGE GAUZE 4X4 12PLY (GAUZE/BANDAGES/DRESSINGS) ×2 IMPLANT
SPONGE GAUZE 4X4 12PLY STER LF (GAUZE/BANDAGES/DRESSINGS) ×2 IMPLANT
SUT ETHILON 4 0 PS 2 18 (SUTURE) ×2 IMPLANT
SYR CONTROL 10ML LL (SYRINGE) ×2 IMPLANT
TOWEL OR 17X24 6PK STRL BLUE (TOWEL DISPOSABLE) ×2 IMPLANT
WAND 30 DEG SABER W/CORD (SURGICAL WAND) IMPLANT
WAND 90 DEG TURBOVAC W/CORD (SURGICAL WAND) IMPLANT
WATER STERILE IRR 500ML POUR (IV SOLUTION) ×2 IMPLANT

## 2013-12-05 NOTE — Anesthesia Preprocedure Evaluation (Addendum)
Anesthesia Evaluation  Patient identified by MRN, date of birth, ID band Patient awake    Reviewed: Allergy & Precautions, H&P , NPO status , Patient's Chart, lab work & pertinent test results, reviewed documented beta blocker date and time   History of Anesthesia Complications (+) PONV  Airway Mallampati: II TM Distance: >3 FB Neck ROM: full    Dental  (+) Dental Advisory Given, Missing Back teeth missing:   Pulmonary neg pulmonary ROS, former smoker,  breath sounds clear to auscultation  Pulmonary exam normal       Cardiovascular Exercise Tolerance: Good hypertension, Pt. on medications and Pt. on home beta blockers Rhythm:regular Rate:Normal     Neuro/Psych Cervical fusion negative neurological ROS  negative psych ROS   GI/Hepatic negative GI ROS, Neg liver ROS, hiatal hernia, GERD-  Medicated and Controlled,  Endo/Other  negative endocrine ROSMorbid obesity  Renal/GU negative Renal ROS  negative genitourinary   Musculoskeletal  (+) Fibromyalgia -  Abdominal (+) + obese,   Peds  Hematology negative hematology ROS (+)   Anesthesia Other Findings   Reproductive/Obstetrics negative OB ROS                        Anesthesia Physical Anesthesia Plan  ASA: III  Anesthesia Plan: General   Post-op Pain Management:    Induction:   Airway Management Planned: LMA  Additional Equipment:   Intra-op Plan:   Post-operative Plan:   Informed Consent: I have reviewed the patients History and Physical, chart, labs and discussed the procedure including the risks, benefits and alternatives for the proposed anesthesia with the patient or authorized representative who has indicated his/her understanding and acceptance.   Dental Advisory Given  Plan Discussed with: CRNA and Surgeon  Anesthesia Plan Comments:        Anesthesia Quick Evaluation

## 2013-12-05 NOTE — Progress Notes (Signed)
Patient stated that she slid off of chair while putting on yellow slippers.  She stated that she usually wheres a knee brace but did not wear it today.  She also stated that her whole family fall easy.  No one observed that she slip off the chair.  She stated that she was not hurt just slid down.  It was reported to Building surveyorwanda gaddy  Manager.

## 2013-12-05 NOTE — H&P (Signed)
Diane Lin is an 58 y.o. female.   Chief Complaint: right knee pain HPI: Patient presents with joint discomfort that had been persistent for some time now. Despite conservative treatments, her discomfort has not improved. Imaging was obtained. Other conservative and surgical treatments were discussed in detail. Patient wishes to proceed with surgery as consented. Denies SOB, CP, or calf pain. No Fever, chills, or nausea/ vomiting.   Past Medical History  Diagnosis Date  . Depression   . GERD (gastroesophageal reflux disease)   . PONV (postoperative nausea and vomiting)   . Hypertension   . Anxiety   . H/O hiatal hernia   . Fibromyalgia   . Right knee meniscal tear   . Chronic back pain   . Arthritis     back  . OA (osteoarthritis) of knee     bilateral  . Wears glasses     Past Surgical History  Procedure Laterality Date  . Anterior cervical decomp/discectomy fusion  05-19-2010    C5 -- C7  . Carpal tunnel release Bilateral 1996  . Trigger finger release  1996    Right thumb  . Knee arthroscopy Bilateral 2004  &  2005  . Laparotomy with ovarian cystectomy and appendectomy  1975  . Dilation and curettage of uterus    . Total abdominal hysterectomy w/ bilateral salpingoophorectomy  1989  . Posterior lumbar diskectomy /  fusion  12-05-2012    L5 --- S1  . Negative sleep study  YRS AGO    History reviewed. No pertinent family history. Social History:  reports that she quit smoking about 2 months ago. Her smoking use included Cigarettes. She has a 34 pack-year smoking history. She has never used smokeless tobacco. She reports that she does not drink alcohol or use illicit drugs.  Allergies:  Allergies  Allergen Reactions  . Aspirin Nausea And Vomiting and Other (See Comments)    Stomach cramps  . Celebrex [Celecoxib] Other (See Comments)    GI  UPSET  . Cymbalta [Duloxetine Hcl] Diarrhea and Other (See Comments)    Stomach upset,  skin irritation   . Milk-Related  Compounds Other (See Comments)    cramping    Medications Prior to Admission  Medication Sig Dispense Refill  . acetaminophen (TYLENOL) 500 MG tablet Take 500 mg by mouth every 6 (six) hours as needed.      . ALPRAZolam (XANAX) 0.5 MG tablet Take 0.5 mg by mouth 4 (four) times daily.      Marland Kitchen. atorvastatin (LIPITOR) 40 MG tablet Take 40 mg by mouth every evening.       . cetirizine (ZYRTEC) 10 MG tablet Take 10 mg by mouth daily.      Marland Kitchen. estradiol (ESTRACE) 0.5 MG tablet Take 0.5 mg by mouth every morning.      . gabapentin (NEURONTIN) 100 MG capsule Take 100 mg by mouth 3 (three) times daily.      . hydrochlorothiazide (MICROZIDE) 12.5 MG capsule Take 12.5 mg by mouth 2 (two) times daily.       . Menthol, Topical Analgesic, (BIOFREEZE EX) Apply topically as needed.      . metoprolol succinate (TOPROL-XL) 50 MG 24 hr tablet Take 50 mg by mouth every morning. Take with or immediately following a meal.      . omeprazole (PRILOSEC) 40 MG capsule Take 40 mg by mouth every morning.      . polyethylene glycol (MIRALAX / GLYCOLAX) packet Take 17 g by mouth daily. For constipation      .  Polyvinyl Alcohol-Povidone (REFRESH OP) Place 1 drop into both eyes 3 (three) times daily.      . Simethicone (GAS RELIEF PO) Take by mouth as needed.        Results for orders placed during the hospital encounter of 12/05/13 (from the past 48 hour(s))  POCT I-STAT, CHEM 8     Status: Abnormal   Collection Time    12/05/13 10:00 AM      Result Value Ref Range   Sodium 144  137 - 147 mEq/L   Potassium 3.4 (*) 3.7 - 5.3 mEq/L   Chloride 100  96 - 112 mEq/L   BUN 18  6 - 23 mg/dL   Creatinine, Ser 4.091.20 (*) 0.50 - 1.10 mg/dL   Glucose, Bld 811102 (*) 70 - 99 mg/dL   Calcium, Ion 9.141.26  7.821.13 - 1.30 mmol/L   TCO2 26  0 - 100 mmol/L   Hemoglobin 15.3 (*) 12.0 - 15.0 g/dL   HCT 95.645.0  21.336.0 - 08.646.0 %   No results found.  Review of Systems  Constitutional: Negative.   HENT: Negative.   Eyes: Negative.   Respiratory:  Negative.   Cardiovascular: Negative.   Gastrointestinal: Negative.   Genitourinary: Negative.   Musculoskeletal: Negative.   Skin: Negative.   Neurological: Negative.   Endo/Heme/Allergies: Negative.   Psychiatric/Behavioral: Negative.     Blood pressure 137/84, pulse 56, temperature 96.8 F (36 C), temperature source Oral, resp. rate 18, height 5\' 3"  (1.6 m), weight 105.688 kg (233 lb), SpO2 97.00%. Physical Exam  Constitutional: She is oriented to person, place, and time. She appears well-developed.  HENT:  Head: Normocephalic.  Eyes: EOM are normal.  Neck: Normal range of motion.  Cardiovascular: Normal rate and normal heart sounds.   Respiratory: Effort normal and breath sounds normal.  Genitourinary:  Deferred  Musculoskeletal: She exhibits tenderness.  Right knee  Neurological: She is alert and oriented to person, place, and time.  Skin: Skin is warm and dry.  Psychiatric: Her behavior is normal.     Assessment/Plan: Right knee torn meniscus: Arthroscopy with meniscectomy D/C home  Follow up in office Follow D/c instrcutions  Chaitra Mast L Kiyoko Mcguirt 12/05/2013, 1:38 PM

## 2013-12-05 NOTE — Anesthesia Postprocedure Evaluation (Signed)
  Anesthesia Post-op Note  Patient: Diane Lin  Procedure(s) Performed: Procedure(s) (LRB): RIGHT KNEE ARTHROSCOPY WITH DEBRIDEMENT AND MENISCECTOMY (Right)  Patient Location: PACU  Anesthesia Type: General  Level of Consciousness: awake and alert   Airway and Oxygen Therapy: Patient Spontanous Breathing  Post-op Pain: mild  Post-op Assessment: Post-op Vital signs reviewed, Patient's Cardiovascular Status Stable, Respiratory Function Stable, Patent Airway and No signs of Nausea or vomiting  Last Vitals:  Filed Vitals:   12/05/13 1500  BP: 142/71  Pulse: 66  Temp:   Resp: 15    Post-op Vital Signs: stable   Complications: No apparent anesthesia complications

## 2013-12-05 NOTE — H&P (View-Only) (Signed)
Patient stated that she slid off of chair while putting on yellow slippers.  She stated that she usually wheres a knee brace but did not wear it today.  She also stated that her whole family fall easy.  No one observed that she slip off the chair.  She stated that she was not hurt just slid down.  It was reported to wanda gaddy  Manager. 

## 2013-12-05 NOTE — Interval H&P Note (Signed)
History and Physical Interval Note:  12/05/2013 1:42 PM  Asencion IslamJanice P Dolce  has presented today for surgery, with the diagnosis of RIGHT KNEE TORN MEDIAL AND LATERAL MENISCUS  AND OSTEOARTHRITIS    The various methods of treatment have been discussed with the patient and family. After consideration of risks, benefits and other options for treatment, the patient has consented to  Procedure(s): RIGHT KNEE ARTHROSCOPY WITH DEBRIDEMENT AND MENISCECTOMY (Right) as a surgical intervention .  The patient's history has been reviewed, patient examined, no change in status, stable for surgery.  I have reviewed the patient's chart and labs.  Questions were answered to the patient's satisfaction.     Eugenia Mcalpineobert Kamoni Gentles

## 2013-12-05 NOTE — Transfer of Care (Signed)
Immediate Anesthesia Transfer of Care Note  Patient: Asencion IslamJanice P Knoth  Procedure(s) Performed: Procedure(s) (LRB): RIGHT KNEE ARTHROSCOPY WITH DEBRIDEMENT AND MENISCECTOMY (Right)  Patient Location: PACU  Anesthesia Type: General  Level of Consciousness: awake, oriented, sedated and patient cooperative  Airway & Oxygen Therapy: Patient Spontanous Breathing and Patient connected to face mask oxygen  Post-op Assessment: Report given to PACU RN and Post -op Vital signs reviewed and stable  Post vital signs: Reviewed and stable  Complications: No apparent anesthesia complications

## 2013-12-05 NOTE — Anesthesia Procedure Notes (Signed)
Procedure Name: LMA Insertion Date/Time: 12/05/2013 1:56 PM Performed by: Renella CunasHAZEL, Ripley Bogosian D Pre-anesthesia Checklist: Patient identified, Emergency Drugs available, Suction available and Patient being monitored Patient Re-evaluated:Patient Re-evaluated prior to inductionOxygen Delivery Method: Circle System Utilized Preoxygenation: Pre-oxygenation with 100% oxygen Intubation Type: IV induction Ventilation: Mask ventilation without difficulty LMA: LMA inserted LMA Size: 4.0 Number of attempts: 1 Airway Equipment and Method: bite block Placement Confirmation: positive ETCO2 Tube secured with: Tape Dental Injury: Teeth and Oropharynx as per pre-operative assessment

## 2013-12-05 NOTE — Discharge Instructions (Signed)

## 2013-12-05 NOTE — Op Note (Signed)
Dictated [id number]

## 2013-12-06 ENCOUNTER — Encounter (HOSPITAL_BASED_OUTPATIENT_CLINIC_OR_DEPARTMENT_OTHER): Payer: Self-pay | Admitting: Specialist

## 2013-12-06 NOTE — Op Note (Signed)
NAMECHAKIA, IVESON NO.:  [id number]  MEDICAL RECORD NO.:  [id number]  LOCATION:                                 FACILITY:  PHYSICIAN:  Erasmo Leventhal, M.D.DATE OF BIRTH:  1955-10-18  DATE OF PROCEDURE:  12/05/2013 DATE OF DISCHARGE:                              OPERATIVE REPORT   PREOPERATIVE DIAGNOSES:  Right knee torn medial meniscus, torn lateral meniscus, osteoarthritis.  POSTOPERATIVE DIAGNOSES: 1. Right knee complex tear, lateral meniscus. 2. Osteoarthritis, grade 3 and 4, tricompartmental.  PROCEDURES: 1. Right knee arthroscopic partial lateral meniscectomy. 2. Chondroplasty of patellofemoral joint, medial and lateral     compartments.  SURGEON:  Erasmo Leventhal, M.D.  ASSISTANT:  Arsenio Loader, PA-C.  ANESTHESIA:  General with intraoperative knee block by herself.  ESTIMATED BLOOD LOSS:  Minimal.  DRAINS:  None.  COMPLICATIONS:  None.  TOURNIQUET TIME:  35 minutes at 200 mmHg.  DISPOSITION:  To PACU, stable.  COMPLICATIONS:  None.  OPERATIVE DETAILS:  The patient was counseled in the holding area. Correct site was marked and signed appropriately.  Taken to the operating room, IV Ancef was given.  Placed supine.  Placed under general anesthesia.  Right thigh was placed to a thigh holder, prepped with DuraPrep and draped in the sterile fashion.  Time-out was done and confirmed the right side.  Exsanguinated with an Esmarch.  Arthroscopic portal was established proximal medial, inferomedial, and inferolateral. Diagnostic arthroscopy of suprapatellar pouch, medial and lateral gutters revealed extensive chondromalacia.  Chondroplasty was performed with mechanical shaver both femoral trochlea and the patella, grade 3 and 4 changes.  ACL and PCL were intact.  Medial and lateral gutters were unremarkable.  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  The medial meniscus was intact.  Mechanical chondroplasty was performed with a shaver and medial femoral condyle and medial tibial plateau.  _______ There was a complex degenerative type tear involving the anterior two-thirds of lateral meniscus, but utilized motorized shaver and baskets.  Partial lateral meniscectomy was performed back to stable edge, part of this was removed.  Also extensive grade 3 chondromalacia of the lateral compartments and several flaps and mechanical chondroplasty was performed back to stable edges.  There was no other abnormalities, irrigated and arthroscopic equipments were removed.  The portals were closed with 4-0 nylon suture.  At the end of the case, 10 mL of 0.25% Sensorcaine was placed to the skin and 20 mL of 0.25 Sensorcaine, and 4 mg of morphine sulfate was injected into the knee joint.  There was no complications or problems.  Sterile dressing was applied.  She was awakened, taken from the operating room to the PACU in stable condition.  She will be stabilized in the PACU and discharged to home.          ______________________________ Erasmo Leventhal, M.D.     RAC/MEDQ  D:  12/05/2013  T:  12/06/2013  Job:  [id number]

## 2014-03-31 ENCOUNTER — Encounter (HOSPITAL_BASED_OUTPATIENT_CLINIC_OR_DEPARTMENT_OTHER): Payer: Self-pay | Admitting: *Deleted

## 2014-03-31 NOTE — Progress Notes (Signed)
NPO AFTER MN WITH EXCEPTION CLEAR LIQUIDS UNTIL 0800 (NO CREAM/ MILK PRODUCTS ). ARRIVE AT 1215. NEEDS ISTAT. CURRENT EKG IN CHART AND EPIC. WILL TAKE GABAPENTIN, ESTRADIAL, PRILOSEC, AND METOPROLOL AM DOS W/ SIPS OF WATER. 

## 2014-04-02 ENCOUNTER — Other Ambulatory Visit: Payer: Self-pay | Admitting: Orthopedic Surgery

## 2014-04-04 ENCOUNTER — Ambulatory Visit (HOSPITAL_BASED_OUTPATIENT_CLINIC_OR_DEPARTMENT_OTHER): Payer: 59 | Admitting: Anesthesiology

## 2014-04-04 ENCOUNTER — Encounter (HOSPITAL_BASED_OUTPATIENT_CLINIC_OR_DEPARTMENT_OTHER): Payer: Self-pay | Admitting: *Deleted

## 2014-04-04 ENCOUNTER — Encounter (HOSPITAL_BASED_OUTPATIENT_CLINIC_OR_DEPARTMENT_OTHER): Payer: 59 | Admitting: Anesthesiology

## 2014-04-04 ENCOUNTER — Encounter (HOSPITAL_BASED_OUTPATIENT_CLINIC_OR_DEPARTMENT_OTHER)
Admission: RE | Disposition: A | Discharge status: Home / Self Care | Payer: Self-pay | Source: Ambulatory Visit | Attending: Specialist

## 2014-04-04 ENCOUNTER — Ambulatory Visit (HOSPITAL_BASED_OUTPATIENT_CLINIC_OR_DEPARTMENT_OTHER)
Admission: RE | Admit: 2014-04-04 | Discharge: 2014-04-04 | Disposition: A | Discharge status: Home / Self Care | Payer: 59 | Source: Ambulatory Visit | Attending: Specialist | Admitting: Specialist

## 2014-04-04 DIAGNOSIS — S83289A Other tear of lateral meniscus, current injury, unspecified knee, initial encounter: Secondary | ICD-10-CM | POA: Insufficient documentation

## 2014-04-04 DIAGNOSIS — K219 Gastro-esophageal reflux disease without esophagitis: Secondary | ICD-10-CM | POA: Insufficient documentation

## 2014-04-04 DIAGNOSIS — M549 Dorsalgia, unspecified: Secondary | ICD-10-CM | POA: Insufficient documentation

## 2014-04-04 DIAGNOSIS — IMO0001 Reserved for inherently not codable concepts without codable children: Secondary | ICD-10-CM | POA: Insufficient documentation

## 2014-04-04 DIAGNOSIS — Z9889 Other specified postprocedural states: Secondary | ICD-10-CM

## 2014-04-04 DIAGNOSIS — X58XXXA Exposure to other specified factors, initial encounter: Secondary | ICD-10-CM | POA: Insufficient documentation

## 2014-04-04 DIAGNOSIS — G8929 Other chronic pain: Secondary | ICD-10-CM | POA: Diagnosis not present

## 2014-04-04 DIAGNOSIS — F329 Major depressive disorder, single episode, unspecified: Secondary | ICD-10-CM | POA: Insufficient documentation

## 2014-04-04 DIAGNOSIS — M1710 Unilateral primary osteoarthritis, unspecified knee: Secondary | ICD-10-CM | POA: Diagnosis not present

## 2014-04-04 DIAGNOSIS — F3289 Other specified depressive episodes: Secondary | ICD-10-CM | POA: Diagnosis not present

## 2014-04-04 DIAGNOSIS — M9420 Chondromalacia, unspecified site: Secondary | ICD-10-CM | POA: Diagnosis not present

## 2014-04-04 DIAGNOSIS — I10 Essential (primary) hypertension: Secondary | ICD-10-CM | POA: Diagnosis not present

## 2014-04-04 HISTORY — DX: Other acute postprocedural pain: G89.18

## 2014-04-04 HISTORY — DX: Pain in right knee: M25.561

## 2014-04-04 HISTORY — PX: KNEE ARTHROSCOPY WITH LATERAL RELEASE: SHX5649

## 2014-04-04 HISTORY — DX: Unspecified tear of unspecified meniscus, current injury, left knee, initial encounter: S83.207A

## 2014-04-04 LAB — POCT I-STAT 4, (NA,K, GLUC, HGB,HCT)
Glucose, Bld: 99 mg/dL (ref 70–99)
HEMATOCRIT: 38 % (ref 36.0–46.0)
Hemoglobin: 12.9 g/dL (ref 12.0–15.0)
Potassium: 3.6 mEq/L — ABNORMAL LOW (ref 3.7–5.3)
Sodium: 138 mEq/L (ref 137–147)

## 2014-04-04 LAB — POCT I-STAT, CHEM 8
BUN: 15 mg/dL (ref 6–23)
CREATININE: 1 mg/dL (ref 0.50–1.10)
Calcium, Ion: 1.08 mmol/L — ABNORMAL LOW (ref 1.13–1.30)
Chloride: 103 mEq/L (ref 96–112)
Glucose, Bld: 100 mg/dL — ABNORMAL HIGH (ref 70–99)
HCT: 40 % (ref 36.0–46.0)
HEMOGLOBIN: 13.6 g/dL (ref 12.0–15.0)
Potassium: 3.6 mEq/L — ABNORMAL LOW (ref 3.7–5.3)
Sodium: 138 mEq/L (ref 137–147)
TCO2: 26 mmol/L (ref 0–100)

## 2014-04-04 SURGERY — ARTHROSCOPY, KNEE, WITH LATERAL RETINACULUM RELEASE
Anesthesia: General | Site: Knee | Laterality: Left

## 2014-04-04 MED ORDER — HYDROCODONE-ACETAMINOPHEN 7.5-325 MG PO TABS: 1.0000 | ORAL_TABLET | ORAL | Status: AC | PRN

## 2014-04-04 MED ORDER — HYDROCODONE-ACETAMINOPHEN 7.5-325 MG/15ML PO SOLN
10.0000 mL | Freq: Four times a day (QID) | ORAL | Status: DC | PRN
  Filled 2014-04-04: qty 15

## 2014-04-04 MED ORDER — PROPOFOL 10 MG/ML IV BOLUS
INTRAVENOUS | Status: DC | PRN
  Administered 2014-04-04: 200 mg via INTRAVENOUS

## 2014-04-04 MED ORDER — MIDAZOLAM HCL 2 MG/2ML IJ SOLN
INTRAMUSCULAR | Status: AC
  Filled 2014-04-04: qty 2

## 2014-04-04 MED ORDER — CEFAZOLIN SODIUM-DEXTROSE 2-3 GM-% IV SOLR
INTRAVENOUS | Status: AC
  Filled 2014-04-04: qty 50

## 2014-04-04 MED ORDER — LACTATED RINGERS IV SOLN
INTRAVENOUS | Status: DC
  Administered 2014-04-04 (×2): via INTRAVENOUS
  Filled 2014-04-04: qty 1000

## 2014-04-04 MED ORDER — LIDOCAINE HCL (CARDIAC) 20 MG/ML IV SOLN
INTRAVENOUS | Status: DC | PRN
  Administered 2014-04-04: 60 mg via INTRAVENOUS

## 2014-04-04 MED ORDER — MIDAZOLAM HCL 5 MG/5ML IJ SOLN
INTRAMUSCULAR | Status: DC | PRN
  Administered 2014-04-04: 2 mg via INTRAVENOUS

## 2014-04-04 MED ORDER — PROMETHAZINE HCL 25 MG/ML IJ SOLN
6.2500 mg | INTRAMUSCULAR | Status: DC | PRN
  Filled 2014-04-04: qty 1

## 2014-04-04 MED ORDER — FENTANYL CITRATE 0.05 MG/ML IJ SOLN
INTRAMUSCULAR | Status: AC
  Filled 2014-04-04: qty 6

## 2014-04-04 MED ORDER — FENTANYL CITRATE 0.05 MG/ML IJ SOLN
INTRAMUSCULAR | Status: AC
  Filled 2014-04-04: qty 2

## 2014-04-04 MED ORDER — POVIDONE-IODINE 7.5 % EX SOLN
Freq: Once | CUTANEOUS | Status: DC
  Filled 2014-04-04: qty 118

## 2014-04-04 MED ORDER — DEXAMETHASONE SODIUM PHOSPHATE 4 MG/ML IJ SOLN
INTRAMUSCULAR | Status: DC | PRN
  Administered 2014-04-04: 10 mg via INTRAVENOUS

## 2014-04-04 MED ORDER — HYDROCODONE-ACETAMINOPHEN 7.5-325 MG PO TABS
1.0000 | ORAL_TABLET | Freq: Four times a day (QID) | ORAL | Status: DC | PRN
  Administered 2014-04-04: 1 via ORAL
  Filled 2014-04-04: qty 1

## 2014-04-04 MED ORDER — CEPHALEXIN 500 MG PO CAPS: 500.0000 mg | ORAL_CAPSULE | Freq: Three times a day (TID) | ORAL | Status: AC

## 2014-04-04 MED ORDER — HYDROCODONE-ACETAMINOPHEN 7.5-325 MG PO TABS
ORAL_TABLET | ORAL | Status: AC
  Filled 2014-04-04: qty 1

## 2014-04-04 MED ORDER — MORPHINE SULFATE 4 MG/ML IJ SOLN
INTRAMUSCULAR | Status: DC | PRN
  Administered 2014-04-04: 4 mg via INTRAVENOUS

## 2014-04-04 MED ORDER — ONDANSETRON HCL 4 MG/2ML IJ SOLN
INTRAMUSCULAR | Status: DC | PRN
Start: 2014-04-04 — End: 2014-04-04
  Administered 2014-04-04: 4 mg via INTRAVENOUS

## 2014-04-04 MED ORDER — BUPIVACAINE HCL (PF) 0.25 % IJ SOLN
INTRAMUSCULAR | Status: DC | PRN
  Administered 2014-04-04: 30 mL

## 2014-04-04 MED ORDER — SODIUM CHLORIDE 0.9 % IR SOLN
Status: DC | PRN
  Administered 2014-04-04: 6000 mL

## 2014-04-04 MED ORDER — FENTANYL CITRATE 0.05 MG/ML IJ SOLN
25.0000 ug | INTRAMUSCULAR | Status: DC | PRN
  Administered 2014-04-04: 25 ug via INTRAVENOUS
  Filled 2014-04-04: qty 1

## 2014-04-04 MED ORDER — MORPHINE SULFATE 4 MG/ML IJ SOLN
INTRAMUSCULAR | Status: AC
  Filled 2014-04-04: qty 1

## 2014-04-04 MED ORDER — CEFAZOLIN SODIUM-DEXTROSE 2-3 GM-% IV SOLR
2.0000 g | INTRAVENOUS | Status: AC
  Administered 2014-04-04: 2 g via INTRAVENOUS
  Filled 2014-04-04: qty 50

## 2014-04-04 MED ORDER — BELLADONNA ALKALOIDS-OPIUM 16.2-60 MG RE SUPP
RECTAL | Status: AC
Start: 2014-04-04 — End: 2014-04-04
  Filled 2014-04-04: qty 1

## 2014-04-04 MED ORDER — FENTANYL CITRATE 0.05 MG/ML IJ SOLN
INTRAMUSCULAR | Status: DC | PRN
  Administered 2014-04-04 (×2): 25 ug via INTRAVENOUS
  Administered 2014-04-04: 50 ug via INTRAVENOUS

## 2014-04-04 MED ORDER — EPHEDRINE SULFATE 50 MG/ML IJ SOLN
INTRAMUSCULAR | Status: DC | PRN
  Administered 2014-04-04: 10 mg via INTRAVENOUS

## 2014-04-04 SURGICAL SUPPLY — 56 items
ALLEN LEG HOLDER PAD ×2 IMPLANT
BANDAGE ELASTIC 6 VELCRO ST LF (GAUZE/BANDAGES/DRESSINGS) ×2 IMPLANT
BANDAGE ESMARK 6X9 LF (GAUZE/BANDAGES/DRESSINGS) IMPLANT
BLADE 4.2CUDA (BLADE) IMPLANT
BLADE CUDA 4.2 (BLADE) IMPLANT
BLADE CUDA GRT WHITE 3.5 (BLADE) ×2 IMPLANT
BLADE CUDA SHAVER 3.5 (BLADE) IMPLANT
BNDG ESMARK 6X9 LF (GAUZE/BANDAGES/DRESSINGS)
BNDG GAUZE ELAST 4 BULKY (GAUZE/BANDAGES/DRESSINGS) ×2 IMPLANT
CANISTER SUCT LVC 12 LTR MEDI- (MISCELLANEOUS) ×4 IMPLANT
CANISTER SUCTION 1200CC (MISCELLANEOUS) ×2 IMPLANT
CLOTH BEACON ORANGE TIMEOUT ST (SAFETY) ×2 IMPLANT
DRAPE ARTHROSCOPY W/POUCH 114 (DRAPES) ×2 IMPLANT
DRAPE INCISE 23X17 IOBAN STRL (DRAPES) ×1
DRAPE INCISE IOBAN 23X17 STRL (DRAPES) ×1 IMPLANT
DRSG EMULSION OIL 3X3 NADH (GAUZE/BANDAGES/DRESSINGS) ×2 IMPLANT
DRSG PAD ABDOMINAL 8X10 ST (GAUZE/BANDAGES/DRESSINGS) ×2 IMPLANT
DURAPREP 26ML APPLICATOR (WOUND CARE) ×2 IMPLANT
ELECT MENISCUS 165MM 90D (ELECTRODE) IMPLANT
ELECT REM PT RETURN 9FT ADLT (ELECTROSURGICAL)
ELECTRODE REM PT RTRN 9FT ADLT (ELECTROSURGICAL) IMPLANT
GAUZE XEROFORM 1X8 LF (GAUZE/BANDAGES/DRESSINGS) ×2 IMPLANT
GLOVE BIO SURGEON STRL SZ7.5 (GLOVE) ×2 IMPLANT
GLOVE BIOGEL M 6.5 STRL (GLOVE) ×2 IMPLANT
GLOVE BIOGEL PI IND STRL 6.5 (GLOVE) ×1 IMPLANT
GLOVE BIOGEL PI IND STRL 7.5 (GLOVE) ×1 IMPLANT
GLOVE BIOGEL PI INDICATOR 6.5 (GLOVE) ×1
GLOVE BIOGEL PI INDICATOR 7.5 (GLOVE) ×1
GLOVE INDICATOR 8.0 STRL GRN (GLOVE) ×2 IMPLANT
GLOVE SURG ORTHO 8.0 STRL STRW (GLOVE) ×4 IMPLANT
GOWN PREVENTION PLUS LG XLONG (DISPOSABLE) ×2 IMPLANT
GOWN STRL REIN XL XLG (GOWN DISPOSABLE) IMPLANT
GOWN STRL REUS W/TWL LRG LVL3 (GOWN DISPOSABLE) IMPLANT
GOWN STRL REUS W/TWL XL LVL3 (GOWN DISPOSABLE) ×4 IMPLANT
IMMOBILIZER KNEE 22 UNIV (SOFTGOODS) IMPLANT
IMMOBILIZER KNEE 24 THIGH 36 (MISCELLANEOUS) IMPLANT
IMMOBILIZER KNEE 24 UNIV (MISCELLANEOUS)
KNEE WRAP E Z 3 GEL PACK (MISCELLANEOUS) ×2 IMPLANT
MINI VAC (SURGICAL WAND) ×2 IMPLANT
PACK ARTHROSCOPY DSU (CUSTOM PROCEDURE TRAY) ×2 IMPLANT
PACK BASIN DAY SURGERY FS (CUSTOM PROCEDURE TRAY) ×2 IMPLANT
PAD ABD 8X10 STRL (GAUZE/BANDAGES/DRESSINGS) ×4 IMPLANT
PADDING CAST ABS 4INX4YD NS (CAST SUPPLIES) ×1
PADDING CAST ABS COTTON 4X4 ST (CAST SUPPLIES) ×1 IMPLANT
PENCIL BUTTON HOLSTER BLD 10FT (ELECTRODE) IMPLANT
SET ARTHROSCOPY TUBING (MISCELLANEOUS) ×1
SET ARTHROSCOPY TUBING LN (MISCELLANEOUS) ×1 IMPLANT
SPONGE GAUZE 4X4 12PLY (GAUZE/BANDAGES/DRESSINGS) ×2 IMPLANT
SPONGE GAUZE 4X4 12PLY STER LF (GAUZE/BANDAGES/DRESSINGS) ×2 IMPLANT
SUT ETHILON 3 0 PS 1 (SUTURE) ×2 IMPLANT
SYR CONTROL 10ML LL (SYRINGE) ×2 IMPLANT
TOWEL OR 17X24 6PK STRL BLUE (TOWEL DISPOSABLE) ×2 IMPLANT
TUBE CONNECTING 12X1/4 (SUCTIONS) ×2 IMPLANT
WAND 30 DEG SABER W/CORD (SURGICAL WAND) IMPLANT
WAND 90 DEG TURBOVAC W/CORD (SURGICAL WAND) IMPLANT
WATER STERILE IRR 500ML POUR (IV SOLUTION) ×2 IMPLANT

## 2014-04-04 NOTE — Discharge Instructions (Signed)

## 2014-04-04 NOTE — Op Note (Signed)
[phone]

## 2014-04-04 NOTE — Interval H&P Note (Signed)
History and Physical Interval Note:  04/04/2014 1:22 PM  Diane Lin  has presented today for surgery, with the diagnosis of LEFT KNEE OA AND TORN LATERAL MENISCUS   The various methods of treatment have been discussed with the patient and family. After consideration of risks, benefits and other options for treatment, the patient has consented to  Procedure(s): LEFT KNEE ARTHROSCOPY WITH DEBRIDEMENT PARTIAL MENISCECTOMY AND POSSIBLE CHRONDROPLASTY  (Left) as a surgical intervention .  The patient's history has been reviewed, patient examined, no change in status, stable for surgery.  I have reviewed the patient's chart and labs.  Questions were answered to the patient's satisfaction.     Eleanor Gatliff ANDREW

## 2014-04-04 NOTE — H&P (View-Only) (Signed)
NPO AFTER MN WITH EXCEPTION CLEAR LIQUIDS UNTIL 0800 (NO CREAM/ MILK PRODUCTS ). ARRIVE AT 1215. NEEDS ISTAT. CURRENT EKG IN CHART AND EPIC. WILL TAKE GABAPENTIN, ESTRADIAL, PRILOSEC, AND METOPROLOL AM DOS W/ SIPS OF WATER.

## 2014-04-04 NOTE — Anesthesia Procedure Notes (Signed)
Procedure Name: LMA Insertion Date/Time: 04/04/2014 2:29 PM Performed by: Renella Cunas D Pre-anesthesia Checklist: Patient identified, Emergency Drugs available, Suction available and Patient being monitored Patient Re-evaluated:Patient Re-evaluated prior to inductionOxygen Delivery Method: Circle System Utilized Preoxygenation: Pre-oxygenation with 100% oxygen Intubation Type: IV induction Ventilation: Mask ventilation without difficulty LMA: LMA inserted LMA Size: 4.0 Number of attempts: 1 Airway Equipment and Method: bite block Placement Confirmation: positive ETCO2 Tube secured with: Tape Dental Injury: Teeth and Oropharynx as per pre-operative assessment

## 2014-04-04 NOTE — H&P (Signed)
Diane Lin is an 59 y.o. female.   Chief Complaint: Left knee pain for months HPI: Patient presents with joint discomfort that had been persistent for several months now. Despite conservative treatments, her discomfort has not improved. Imaging was obtained. Other conservative and surgical treatments were discussed in detail. Patient wishes to proceed with surgery as consented. Denies SOB, CP, or calf pain. No Fever, chills, or nausea/ vomiting.   Past Medical History  Diagnosis Date  . Depression   . GERD (gastroesophageal reflux disease)   . PONV (postoperative nausea and vomiting)   . Hypertension   . Anxiety   . H/O hiatal hernia   . Fibromyalgia   . Chronic back pain   . Arthritis     back  . OA (osteoarthritis) of knee     bilateral  . Wears glasses   . Acute meniscal tear of left knee   . Postoperative pain of right knee     Past Surgical History  Procedure Laterality Date  . Anterior cervical decomp/discectomy fusion  05-19-2010    C5 -- C7  . Carpal tunnel release Bilateral 1996  . Trigger finger release  1996    Right thumb  . Knee arthroscopy Bilateral 2004  &  2005  . Laparotomy with ovarian cystectomy and appendectomy  1975  . Dilation and curettage of uterus    . Total abdominal hysterectomy w/ bilateral salpingoophorectomy  1989  . Posterior lumbar diskectomy /  fusion  12-05-2012    L5 --- S1  . Negative sleep study  YRS AGO  . Knee arthroscopy with medial menisectomy Right 12/05/2013    Procedure: RIGHT KNEE ARTHROSCOPY WITH DEBRIDEMENT AND MENISCECTOMY;  Surgeon: Eugenia Mcalpine, MD;  Location: [facility];  Service: Orthopedics;  Laterality: Right;    History reviewed. No pertinent family history. Social History:  reports that she quit smoking about 6 months ago. Her smoking use included Cigarettes. She has a 34 pack-year smoking history. She has never used smokeless tobacco. She reports that she does not drink alcohol or use illicit  drugs.  Allergies:  Allergies  Allergen Reactions  . Ace Inhibitors   . Aspirin Nausea And Vomiting and Other (See Comments)    Stomach cramps  . Celebrex [Celecoxib] Other (See Comments)    GI  UPSET  . Cymbalta [Duloxetine Hcl] Diarrhea and Other (See Comments)    Stomach upset,  skin irritation   . Milk-Related Compounds Other (See Comments)    cramping    Medications Prior to Admission  Medication Sig Dispense Refill  . ALPRAZolam (XANAX) 0.5 MG tablet Take 0.5 mg by mouth 4 (four) times daily.      Marland Kitchen atorvastatin (LIPITOR) 40 MG tablet Take 40 mg by mouth every evening.       . cetirizine (ZYRTEC) 10 MG tablet Take 10 mg by mouth daily.      . ergocalciferol (VITAMIN D2) 50000 UNITS capsule Take 50,000 Units by mouth 2 (two) times a week.      . estradiol (ESTRACE) 1 MG tablet Take 1 mg by mouth every morning.      . gabapentin (NEURONTIN) 100 MG capsule Take 100 mg by mouth 3 (three) times daily.      . hydrochlorothiazide (MICROZIDE) 12.5 MG capsule Take 12.5 mg by mouth 2 (two) times daily.       Marland Kitchen HYDROcodone-acetaminophen (NORCO) 5-325 MG per tablet Take 1-2 tablets by mouth every 4 (four) hours as needed for moderate pain.  60 tablet  0  . metoprolol succinate (TOPROL-XL) 50 MG 24 hr tablet Take 50 mg by mouth every morning. Take with or immediately following a meal.      . omeprazole (PRILOSEC) 40 MG capsule Take 40 mg by mouth every morning.      . polyethylene glycol (MIRALAX / GLYCOLAX) packet Take 17 g by mouth daily. For constipation      . Polyvinyl Alcohol-Povidone (REFRESH OP) Place 1 drop into both eyes 3 (three) times daily.      . sertraline (ZOLOFT) 100 MG tablet Take 100 mg by mouth daily.      . Simethicone (GAS RELIEF PO) Take by mouth as needed.      . Menthol, Topical Analgesic, (BIOFREEZE EX) Apply topically as needed.        No results found for this or any previous visit (from the past 48 hour(s)). No results found.  Review of Systems   Constitutional: Negative.   HENT: Negative.   Eyes: Negative.   Respiratory: Negative.   Cardiovascular: Negative.   Gastrointestinal: Positive for heartburn.  Genitourinary: Negative.   Musculoskeletal: Positive for joint pain.  Skin: Negative.   Neurological: Negative.   Endo/Heme/Allergies: Negative.   Psychiatric/Behavioral: Positive for depression. The patient is nervous/anxious.     Blood pressure 141/74, pulse 58, temperature 97.8 F (36.6 C), temperature source Oral, resp. rate 16, height  (1.6 m), weight 110.678 kg (244 lb), SpO2 99.00%. Physical Exam  Constitutional: She is oriented to person, place, and time. She appears well-developed.  HENT:  Head: Normocephalic.  Eyes: EOM are normal.  Neck: Normal range of motion.  Cardiovascular: Normal rate, normal heart sounds and intact distal pulses.   Respiratory: Effort normal and breath sounds normal.  GI: Soft.  Genitourinary:  deferred  Musculoskeletal:  Left knee pain. Calf soft and on tender.  Neurological: She is alert and oriented to person, place, and time.  Skin: Skin is warm and dry.  Psychiatric: Her behavior is normal.     Assessment/Plan Left knee OA and torn meniscus: Left knee scope as consented D/c home today F/U in office Follow directions and take medications as directed.   STILWELL, BRYSON L 04/04/2014, 12:43 PM

## 2014-04-04 NOTE — Transfer of Care (Signed)
Immediate Anesthesia Transfer of Care Note  Patient: Diane Lin  Procedure(s) Performed: Procedure(s) (LRB): LEFT KNEE ARTHROSCOPY  PARTIAL  LATERAL MENISCECTOMY CHRONDROPLASTY  (Left)  Patient Location: PACU  Anesthesia Type: General  Level of Consciousness: awake, oriented, sedated and patient cooperative  Airway & Oxygen Therapy: Patient Spontanous Breathing and Patient connected to face mask oxygen  Post-op Assessment: Report given to PACU RN and Post -op Vital signs reviewed and stable  Post vital signs: Reviewed and stable  Complications: No apparent anesthesia complications

## 2014-04-04 NOTE — Anesthesia Preprocedure Evaluation (Signed)
Anesthesia Evaluation  Patient identified by MRN, date of birth, ID band Patient awake    Reviewed: Allergy & Precautions, H&P , NPO status , Patient's Chart, lab work & pertinent test results  History of Anesthesia Complications (+) PONV and history of anesthetic complications  Airway Mallampati: II TM Distance: >3 FB Neck ROM: Full    Dental no notable dental hx. (+) Partial Upper   Pulmonary neg pulmonary ROS, former smoker,  breath sounds clear to auscultation  Pulmonary exam normal       Cardiovascular Exercise Tolerance: Good hypertension, Pt. on medications and Pt. on home beta blockers Rhythm:Regular Rate:Normal     Neuro/Psych Anxiety Depression negative neurological ROS     GI/Hepatic Neg liver ROS, GERD-  Medicated and Controlled,  Endo/Other  negative endocrine ROS  Renal/GU negative Renal ROS  negative genitourinary   Musculoskeletal  (+) Arthritis -, Osteoarthritis,    Abdominal   Peds negative pediatric ROS (+)  Hematology negative hematology ROS (+)   Anesthesia Other Findings   Reproductive/Obstetrics negative OB ROS                           Anesthesia Physical Anesthesia Plan  ASA: II  Anesthesia Plan: General   Post-op Pain Management:    Induction: Intravenous  Airway Management Planned: LMA  Additional Equipment:   Intra-op Plan:   Post-operative Plan: Extubation in OR  Informed Consent: I have reviewed the patients History and Physical, chart, labs and discussed the procedure including the risks, benefits and alternatives for the proposed anesthesia with the patient or authorized representative who has indicated his/her understanding and acceptance.   Dental advisory given  Plan Discussed with: CRNA  Anesthesia Plan Comments:         Anesthesia Quick Evaluation

## 2014-04-07 ENCOUNTER — Encounter (HOSPITAL_BASED_OUTPATIENT_CLINIC_OR_DEPARTMENT_OTHER): Payer: Self-pay | Admitting: Specialist

## 2014-04-07 NOTE — Anesthesia Postprocedure Evaluation (Signed)
  Anesthesia Post-op Note  Patient: Diane Lin  Procedure(s) Performed: Procedure(s) (LRB): LEFT KNEE ARTHROSCOPY  PARTIAL  LATERAL MENISCECTOMY CHRONDROPLASTY  (Left)  Patient Location: PACU  Anesthesia Type: General  Level of Consciousness: awake and alert   Airway and Oxygen Therapy: Patient Spontanous Breathing  Post-op Pain: mild  Post-op Assessment: Post-op Vital signs reviewed, Patient's Cardiovascular Status Stable, Respiratory Function Stable, Patent Airway and No signs of Nausea or vomiting  Last Vitals:  Filed Vitals:   04/04/14 1655  BP: 127/74  Pulse: 64  Temp: 37 C  Resp: 16    Post-op Vital Signs: stable   Complications: No apparent anesthesia complications

## 2014-04-07 NOTE — Op Note (Signed)
NAMEINDIA, JOLIN NO.:  [id number]  MEDICAL RECORD NO.:  [id number]  LOCATION:                                 FACILITY:  PHYSICIAN:  Erasmo Leventhal, M.D.DATE OF BIRTH:  Mar 15, 1956  DATE OF PROCEDURE:  04/04/2014 DATE OF DISCHARGE:  04/04/2014                              OPERATIVE REPORT   PREOPERATIVE DIAGNOSES:  Left knee probable torn lateral meniscus, osteoarthritis.  POSTOPERATIVE DIAGNOSES: 1. Left knee torn lateral meniscus. 2. Osteoarthritis, grade 3+ patella, grade 3+ femoral trochlea, grade     3-4 lateral femoral condyle and grade 3 medial femoral condyle. 3. 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  PROCEDURES: 1. Left knee arthroscopic partial lateral meniscectomy. 2. Chondroplasty of patella, femoral trochlea, medial femoral condyle     and lateral tibial plateau.  SURGEON:  Erasmo Leventhal, M.D.  ASSISTANT:  Arsenio Loader, PA-C.  ANESTHESIA:  General with intraoperative knee block.  ESTIMATED BLOOD LOSS:  Minimal.  DRAINS:  None.  COMPLICATIONS:  None.  TOURNIQUET TIME:  30 minutes at 220 mmHg.  OPERATIVE DETAILS:  The patient was counseled in the holding area. Correct site was identified.  Taken to the operating room, placed in supine position under general anesthesia.  All extremities were well padded and bumped.  Left thigh was placed on thigh holder and prepped with DuraPrep and draped in a sterile fashion.  The patient was then placed under general anesthesia and time-out had been done to confirm the left side.  Exsanguinated with Esmarch, tourniquet was inflated to 220 mmHg due to large body habitus.  Arthroscopic portal was established proximal medial, inferomedial, inferolateral.  Diagnostic arthroscopy was taken.  Patellofemoral joint revealed normal tracking, which was grade 3+, chondromalacia of the patella and grade 3+ of the femoral trochlea.  Mechanical chondroplasty was performed  with a shaver and back to stable base on both sides.  ACL and PCL were intact.  Lateral side was inspected.  Lateral meniscal tear __________ shaver and also __________.  There was grade 3-4 chondromalacia of the lateral tibial plateau __________.  Lateral femoral condyle __________ changes, 3+. Medial side was inspected.  Medial meniscus was intact.  Medial tibial plateau showed mild fraying, but there was grade 3 chondromalacia of the medial femoral condyle, no stable flap.  Mechanical chondroplasty was performed.  No other abnormalities were noted.  Suprapatellar pouch and medial and lateral gutters, __________ was removed.  Portal was closed with nylon suture.  A 20 mL of Sensorcaine and 4 mg of morphine sulfate were injected into the knee joint.  Satisfied with TED hose, ice pad. No complications.  She was awakened, taken to the operating room in stable condition.  She will be stabilized in PACU and discharged home. Tourniquet was deflated after application of the dressing.          ______________________________ Erasmo Leventhal, M.D.     RAC/MEDQ  D:  04/04/2014  T:  04/05/2014  Job:  [id number]

## 2014-04-11 ENCOUNTER — Ambulatory Visit (HOSPITAL_COMMUNITY)
Admission: RE | Admit: 2014-04-11 | Discharge: 2014-04-11 | Disposition: A | Discharge status: Home / Self Care | Payer: 59 | Source: Ambulatory Visit | Attending: Cardiovascular Disease | Admitting: Cardiovascular Disease

## 2014-04-11 ENCOUNTER — Other Ambulatory Visit (HOSPITAL_COMMUNITY): Payer: Self-pay | Admitting: *Deleted

## 2014-04-11 DIAGNOSIS — Z9889 Other specified postprocedural states: Secondary | ICD-10-CM

## 2014-04-11 DIAGNOSIS — M79609 Pain in unspecified limb: Secondary | ICD-10-CM

## 2014-04-11 DIAGNOSIS — Z96659 Presence of unspecified artificial knee joint: Secondary | ICD-10-CM | POA: Diagnosis not present

## 2014-04-11 DIAGNOSIS — M7989 Other specified soft tissue disorders: Secondary | ICD-10-CM | POA: Diagnosis present

## 2014-04-11 NOTE — Progress Notes (Signed)
Left Lower Ext. Venous Duplex Completed. Negative for DVT or SVT in the left lower ext. Jimmy Plessinger, BS, RDMS, RVT  

## 2014-04-17 ENCOUNTER — Telehealth (HOSPITAL_COMMUNITY): Payer: Self-pay | Admitting: *Deleted
